# Patient Record
Sex: Male | Born: 1935 | Race: White | Hispanic: No | Marital: Married | Smoking: Former smoker
Health system: Southern US, Community
[De-identification: ages and names within clinical notes are randomized; demographics above are authoritative.]

## PROBLEM LIST (undated history)

## (undated) DIAGNOSIS — I10 Essential (primary) hypertension: Secondary | ICD-10-CM

## (undated) DIAGNOSIS — J449 Chronic obstructive pulmonary disease, unspecified: Secondary | ICD-10-CM

## (undated) DIAGNOSIS — I519 Heart disease, unspecified: Secondary | ICD-10-CM

## (undated) HISTORY — DX: Heart disease, unspecified: I51.9

## (undated) HISTORY — DX: Chronic obstructive pulmonary disease, unspecified: J44.9

## (undated) HISTORY — DX: Essential (primary) hypertension: I10

---

## 2006-06-12 ENCOUNTER — Inpatient Hospital Stay: Payer: Self-pay | Admitting: Internal Medicine

## 2006-06-12 ENCOUNTER — Other Ambulatory Visit: Payer: Self-pay

## 2006-06-12 ENCOUNTER — Ambulatory Visit: Payer: Self-pay | Admitting: Cardiology

## 2006-06-13 ENCOUNTER — Other Ambulatory Visit: Payer: Self-pay

## 2006-06-28 ENCOUNTER — Ambulatory Visit: Payer: Self-pay | Admitting: Internal Medicine

## 2008-11-05 DIAGNOSIS — I429 Cardiomyopathy, unspecified: Secondary | ICD-10-CM

## 2008-11-05 DIAGNOSIS — J449 Chronic obstructive pulmonary disease, unspecified: Secondary | ICD-10-CM

## 2008-11-05 DIAGNOSIS — I2510 Atherosclerotic heart disease of native coronary artery without angina pectoris: Secondary | ICD-10-CM | POA: Insufficient documentation

## 2008-11-05 DIAGNOSIS — I10 Essential (primary) hypertension: Secondary | ICD-10-CM

## 2010-11-04 ENCOUNTER — Encounter: Payer: Self-pay | Admitting: Cardiovascular Disease

## 2019-10-04 ENCOUNTER — Encounter: Payer: Self-pay | Admitting: Emergency Medicine

## 2019-10-04 ENCOUNTER — Other Ambulatory Visit: Payer: Self-pay

## 2019-10-04 ENCOUNTER — Inpatient Hospital Stay
Admission: EM | Admit: 2019-10-04 | Discharge: 2019-10-06 | DRG: 180 | Disposition: A | Discharge status: Home / Self Care | Payer: Medicare HMO | Attending: Internal Medicine | Admitting: Internal Medicine

## 2019-10-04 ENCOUNTER — Emergency Department: Payer: Medicare HMO

## 2019-10-04 DIAGNOSIS — I700 Atherosclerosis of aorta: Secondary | ICD-10-CM | POA: Diagnosis present

## 2019-10-04 DIAGNOSIS — J189 Pneumonia, unspecified organism: Secondary | ICD-10-CM | POA: Diagnosis present

## 2019-10-04 DIAGNOSIS — R222 Localized swelling, mass and lump, trunk: Secondary | ICD-10-CM

## 2019-10-04 DIAGNOSIS — Z8249 Family history of ischemic heart disease and other diseases of the circulatory system: Secondary | ICD-10-CM

## 2019-10-04 DIAGNOSIS — I110 Hypertensive heart disease with heart failure: Secondary | ICD-10-CM | POA: Diagnosis present

## 2019-10-04 DIAGNOSIS — I429 Cardiomyopathy, unspecified: Secondary | ICD-10-CM | POA: Diagnosis present

## 2019-10-04 DIAGNOSIS — E039 Hypothyroidism, unspecified: Secondary | ICD-10-CM | POA: Diagnosis present

## 2019-10-04 DIAGNOSIS — Z681 Body mass index (BMI) 19 or less, adult: Secondary | ICD-10-CM

## 2019-10-04 DIAGNOSIS — J439 Emphysema, unspecified: Secondary | ICD-10-CM

## 2019-10-04 DIAGNOSIS — Z87891 Personal history of nicotine dependence: Secondary | ICD-10-CM

## 2019-10-04 DIAGNOSIS — C3411 Malignant neoplasm of upper lobe, right bronchus or lung: Principal | ICD-10-CM

## 2019-10-04 DIAGNOSIS — Z20822 Contact with and (suspected) exposure to covid-19: Secondary | ICD-10-CM | POA: Diagnosis present

## 2019-10-04 DIAGNOSIS — C7972 Secondary malignant neoplasm of left adrenal gland: Secondary | ICD-10-CM | POA: Diagnosis present

## 2019-10-04 DIAGNOSIS — J948 Other specified pleural conditions: Secondary | ICD-10-CM | POA: Diagnosis present

## 2019-10-04 DIAGNOSIS — I5022 Chronic systolic (congestive) heart failure: Secondary | ICD-10-CM | POA: Diagnosis present

## 2019-10-04 DIAGNOSIS — C7970 Secondary malignant neoplasm of unspecified adrenal gland: Secondary | ICD-10-CM | POA: Diagnosis not present

## 2019-10-04 DIAGNOSIS — C7971 Secondary malignant neoplasm of right adrenal gland: Secondary | ICD-10-CM | POA: Diagnosis present

## 2019-10-04 DIAGNOSIS — E785 Hyperlipidemia, unspecified: Secondary | ICD-10-CM

## 2019-10-04 DIAGNOSIS — K869 Disease of pancreas, unspecified: Secondary | ICD-10-CM | POA: Diagnosis present

## 2019-10-04 DIAGNOSIS — R079 Chest pain, unspecified: Secondary | ICD-10-CM

## 2019-10-04 DIAGNOSIS — J440 Chronic obstructive pulmonary disease with acute lower respiratory infection: Secondary | ICD-10-CM | POA: Diagnosis present

## 2019-10-04 DIAGNOSIS — E43 Unspecified severe protein-calorie malnutrition: Secondary | ICD-10-CM | POA: Diagnosis present

## 2019-10-04 DIAGNOSIS — K8689 Other specified diseases of pancreas: Secondary | ICD-10-CM

## 2019-10-04 DIAGNOSIS — I10 Essential (primary) hypertension: Secondary | ICD-10-CM | POA: Diagnosis present

## 2019-10-04 DIAGNOSIS — J449 Chronic obstructive pulmonary disease, unspecified: Secondary | ICD-10-CM

## 2019-10-04 LAB — CBC WITH DIFFERENTIAL/PLATELET
Abs Immature Granulocytes: 0.49 10*3/uL — ABNORMAL HIGH (ref 0.00–0.07)
Basophils Absolute: 0.1 10*3/uL (ref 0.0–0.1)
Basophils Relative: 0 %
Eosinophils Absolute: 0.1 10*3/uL (ref 0.0–0.5)
Eosinophils Relative: 0 %
HCT: 34.4 % — ABNORMAL LOW (ref 39.0–52.0)
Hemoglobin: 10.8 g/dL — ABNORMAL LOW (ref 13.0–17.0)
Immature Granulocytes: 2 %
Lymphocytes Relative: 2 %
Lymphs Abs: 0.7 10*3/uL (ref 0.7–4.0)
MCH: 28.7 pg (ref 26.0–34.0)
MCHC: 31.4 g/dL (ref 30.0–36.0)
MCV: 91.5 fL (ref 80.0–100.0)
Monocytes Absolute: 1.3 10*3/uL — ABNORMAL HIGH (ref 0.1–1.0)
Monocytes Relative: 4 %
Neutro Abs: 29.2 10*3/uL — ABNORMAL HIGH (ref 1.7–7.7)
Neutrophils Relative %: 92 %
Platelets: 401 10*3/uL — ABNORMAL HIGH (ref 150–400)
RBC: 3.76 MIL/uL — ABNORMAL LOW (ref 4.22–5.81)
RDW: 14.2 % (ref 11.5–15.5)
Smear Review: NORMAL
WBC: 31.8 10*3/uL — ABNORMAL HIGH (ref 4.0–10.5)
nRBC: 0 % (ref 0.0–0.2)

## 2019-10-04 LAB — COMPREHENSIVE METABOLIC PANEL
ALT: 12 U/L (ref 0–44)
AST: 19 U/L (ref 15–41)
Albumin: 2.6 g/dL — ABNORMAL LOW (ref 3.5–5.0)
Alkaline Phosphatase: 69 U/L (ref 38–126)
Anion gap: 10 (ref 5–15)
BUN: 25 mg/dL — ABNORMAL HIGH (ref 8–23)
CO2: 23 mmol/L (ref 22–32)
Calcium: 8.6 mg/dL — ABNORMAL LOW (ref 8.9–10.3)
Chloride: 103 mmol/L (ref 98–111)
Creatinine, Ser: 0.83 mg/dL (ref 0.61–1.24)
GFR calc Af Amer: >60 mL/min (ref >60)
GFR calc non Af Amer: >60 mL/min (ref >60)
Glucose, Bld: 120 mg/dL — ABNORMAL HIGH (ref 70–99)
Potassium: 4.3 mmol/L (ref 3.5–5.1)
Sodium: 136 mmol/L (ref 135–145)
Total Bilirubin: 0.6 mg/dL (ref 0.3–1.2)
Total Protein: 6.5 g/dL (ref 6.5–8.1)

## 2019-10-04 LAB — APTT: aPTT: 35 seconds (ref 24–36)

## 2019-10-04 LAB — BRAIN NATRIURETIC PEPTIDE: B Natriuretic Peptide: 148 pg/mL — ABNORMAL HIGH (ref 0.0–100.0)

## 2019-10-04 LAB — PROTIME-INR
INR: 1.1 (ref 0.8–1.2)
Prothrombin Time: 13.4 seconds (ref 11.4–15.2)

## 2019-10-04 LAB — SARS CORONAVIRUS 2 BY RT PCR (HOSPITAL ORDER, PERFORMED IN ~~LOC~~ HOSPITAL LAB): SARS Coronavirus 2: NEGATIVE

## 2019-10-04 MED ORDER — SODIUM CHLORIDE 0.9 % IV SOLN
2.0000 g | Freq: Once | INTRAVENOUS | Status: AC
  Administered 2019-10-04: 2 g via INTRAVENOUS
  Filled 2019-10-04: qty 20

## 2019-10-04 MED ORDER — HEPARIN SODIUM (PORCINE) 5000 UNIT/ML IJ SOLN
5000.0000 [IU] | Freq: Three times a day (TID) | INTRAMUSCULAR | Status: DC
  Administered 2019-10-04 – 2019-10-05 (×3): 5000 [IU] via SUBCUTANEOUS
  Filled 2019-10-04 (×3): qty 1

## 2019-10-04 MED ORDER — ALBUTEROL SULFATE (2.5 MG/3ML) 0.083% IN NEBU
3.0000 mL | INHALATION_SOLUTION | RESPIRATORY_TRACT | Status: DC | PRN
  Administered 2019-10-04: 3 mL via RESPIRATORY_TRACT
  Filled 2019-10-04: qty 3

## 2019-10-04 MED ORDER — SODIUM CHLORIDE 0.9 % IV SOLN
500.0000 mg | Freq: Once | INTRAVENOUS | Status: AC
  Administered 2019-10-04: 500 mg via INTRAVENOUS
  Filled 2019-10-04: qty 500

## 2019-10-04 MED ORDER — ENSURE ENLIVE PO LIQD
237.0000 mL | Freq: Two times a day (BID) | ORAL | Status: DC
  Administered 2019-10-05 – 2019-10-06 (×3): 237 mL via ORAL

## 2019-10-04 MED ORDER — AMLODIPINE BESYLATE 10 MG PO TABS
10.0000 mg | ORAL_TABLET | Freq: Every day | ORAL | Status: DC
  Administered 2019-10-05 – 2019-10-06 (×2): 10 mg via ORAL
  Filled 2019-10-04 (×2): qty 1

## 2019-10-04 MED ORDER — ONDANSETRON HCL 4 MG/2ML IJ SOLN: 4.0000 mg | Freq: Three times a day (TID) | INTRAMUSCULAR | Status: DC | PRN

## 2019-10-04 MED ORDER — ACETAMINOPHEN 325 MG PO TABS
650.0000 mg | ORAL_TABLET | Freq: Four times a day (QID) | ORAL | Status: DC | PRN
  Administered 2019-10-04: 650 mg via ORAL
  Filled 2019-10-04: qty 2

## 2019-10-04 MED ORDER — DM-GUAIFENESIN ER 30-600 MG PO TB12
1.0000 | ORAL_TABLET | Freq: Two times a day (BID) | ORAL | Status: DC | PRN
  Administered 2019-10-04: 1 via ORAL
  Filled 2019-10-04: qty 1

## 2019-10-04 MED ORDER — ROSUVASTATIN CALCIUM 10 MG PO TABS
10.0000 mg | ORAL_TABLET | Freq: Every day | ORAL | Status: DC
  Administered 2019-10-04 – 2019-10-05 (×2): 10 mg via ORAL
  Filled 2019-10-04 (×3): qty 1

## 2019-10-04 MED ORDER — LEVOTHYROXINE SODIUM 50 MCG PO TABS
50.0000 ug | ORAL_TABLET | Freq: Every day | ORAL | Status: DC
  Administered 2019-10-05 – 2019-10-06 (×2): 50 ug via ORAL
  Filled 2019-10-04 (×2): qty 1

## 2019-10-04 MED ORDER — SODIUM CHLORIDE 0.9 % IV SOLN
1.0000 g | INTRAVENOUS | Status: DC
  Administered 2019-10-05 – 2019-10-06 (×2): 1 g via INTRAVENOUS
  Filled 2019-10-04: qty 1
  Filled 2019-10-04: qty 10
  Filled 2019-10-04: qty 1

## 2019-10-04 MED ORDER — ASPIRIN EC 81 MG PO TBEC
81.0000 mg | DELAYED_RELEASE_TABLET | Freq: Every day | ORAL | Status: DC
  Administered 2019-10-05: 10:00:00 81 mg via ORAL
  Filled 2019-10-04: qty 1

## 2019-10-04 MED ORDER — LISINOPRIL 10 MG PO TABS
10.0000 mg | ORAL_TABLET | Freq: Every day | ORAL | Status: DC
  Administered 2019-10-05 – 2019-10-06 (×2): 10 mg via ORAL
  Filled 2019-10-04 (×2): qty 1

## 2019-10-04 MED ORDER — HYDRALAZINE HCL 20 MG/ML IJ SOLN: 5.0000 mg | INTRAMUSCULAR | Status: DC | PRN

## 2019-10-04 MED ORDER — IOHEXOL 350 MG/ML SOLN
75.0000 mL | Freq: Once | INTRAVENOUS | Status: AC | PRN
  Administered 2019-10-04: 75 mL via INTRAVENOUS

## 2019-10-04 MED ORDER — SODIUM CHLORIDE 0.9 % IV SOLN
500.0000 mg | INTRAVENOUS | Status: DC
  Administered 2019-10-05 – 2019-10-06 (×2): 500 mg via INTRAVENOUS
  Filled 2019-10-04 (×3): qty 500

## 2019-10-04 NOTE — ED Notes (Signed)
Lab at room for collection, pt given water and procedure explained

## 2019-10-04 NOTE — ED Notes (Signed)
Lab called to draw second set of blood cultures. This RN attempted x2, both times veins blew. Lab to come collect. Son in room, pt alert, calm.

## 2019-10-04 NOTE — ED Triage Notes (Signed)
Pt to ED via POV c/o shortness of breath, 20 pound wt loss in the last month, and right shoulder pain. Pt is tachypneic in triage, breathing around 40 bpm. Pt is taking shallow breaths. Pt is able to speak in complete sentences and SpO2 is 97%.

## 2019-10-04 NOTE — ED Triage Notes (Addendum)
First nurse note- right shoulder pain and right side pain.  + SHOB.  Pulled for EKG/triage.

## 2019-10-04 NOTE — H&P (Signed)
History and Physical    RUTGER SALTON ZOX:[id number] DOB: Mar 13, 1935 DOA: 10/04/2019  Referring MD/NP/PA:   PCP: System, Pcp Not In   Patient coming from:  The patient is coming from home.  At baseline, pt is independent for most of ADL.        Chief Complaint: Shortness of breath  HPI: Donald Gibbs is a 85 y.o. male with medical history significant of hypertension, hyperlipidemia, COPD, hypothyroidism, sCHF with EF of 40%, who presents with shortness of breath.  Patient has been having shortness of breath for almost 3 weeks.  He also reports right-sided chest pain, right shoulder pain, and mild cough.  He has 20 pounds of weight loss recently.  No fever or chills.  Patient does not have nausea, vomiting, diarrhea, abdominal pain, symptoms of UTI or unilateral weakness.  ED Course: pt was found to have WBC 31.8, pending COVID-19 PCR, electrolytes renal function okay, temperature 96.7, blood pressure 130/61, heart rate 84, RR 40 -->16, oxygen saturation 97% on room air.  Chest x-ray showed pleural-based mass.  Patient is admitted to [facility] bed as inpatient.  Dr.Brahmanday of oncology is consulted by EDP  # CT angiogram of chest showed:  1. Large pleural base mass with right chest wall invasion and resorption of the second third fourth and fifth ribs in varying amounts. Associated right-sided pleural effusion is noted as well as significant lymphangitic spread of neoplasm in the right upper lobe.   2. Bilateral adrenal lesions are noted suspicious for metastatic disease. 3. There is a soft tissue mass lesion which appears to arise from the head of the pancreas superiorly. This may represent metastatic disease although the possibility of a pancreatic primary deserves consideration. 4. No evidence of pulmonary emboli.  5. Aortic Atherosclerosis (ICD10-I70.0) and Emphysema (ICD10-J43.9).   Review of Systems:   General: no fevers, chills, has body weight loss, has poor appetite, has  fatigue HEENT: no blurry vision, hearing changes or sore throat Respiratory: has dyspnea, coughing, no wheezing CV: has chest pain, no palpitations GI: no nausea, vomiting, abdominal pain, diarrhea, constipation GU: no dysuria, burning on urination, increased urinary frequency, hematuria  Ext: no leg edema Neuro: no unilateral weakness, numbness, or tingling, no vision change or hearing loss Skin: no rash, no skin tear. MSK: No muscle spasm, no deformity, no limitation of range of movement in spin. Has right shoulder pain. Heme: No easy bruising.  Travel history: No recent long distant travel.  Allergy: No Known Allergies  Past Medical History:  Diagnosis Date  . Cardiomyopathy    secondary. echo-EF 25-35%. Adensoine myoview- EF 31% w/a moderately dilated ventricle. there was thinning of the inferior wall, tissue attenuation v infarct, no ischemia.   Marland Kitchen COPD (chronic obstructive pulmonary disease) ([place])   . Decreased left ventricular function   . HTN (hypertension)     History reviewed. No pertinent surgical history.  Social History:  reports that he has quit smoking. He has never used smokeless tobacco. He reports previous alcohol use. He reports previous drug use.  Family History:  Family History  Problem Relation Age of Onset  . Heart attack Brother      Prior to Admission medications   Not on File    Physical Exam: Vitals:   10/04/19 1430 10/04/19 1500 10/04/19 1515 10/04/19 1706  BP: (!) 142/62 140/69  (!) 134/51  Pulse: 99 (!) 101  98  Resp:   (!) 32 (!) 22  Temp:    98.3 F (36.8  C)  TempSrc:    Oral  SpO2: 92% 93%  90%  Weight:      Height:       General: Not in acute distress.  Thin body habitus HEENT:       Eyes: PERRL, EOMI, no scleral icterus.       ENT: No discharge from the ears and nose, no pharynx injection, no tonsillar enlargement.        Neck: No JVD, no bruit, no mass felt. Heme: No neck lymph node enlargement. Cardiac: S1/S2, RRR, No  murmurs, No gallops or rubs. Respiratory: No rales, wheezing, rhonchi or rubs. GI: Soft, nondistended, nontender, no rebound pain, no organomegaly, BS present. GU: No hematuria Ext: No pitting leg edema bilaterally. 2+DP/PT pulse bilaterally. Musculoskeletal: No joint deformities, No joint redness or warmth, no limitation of ROM in spin. Skin: No rashes.  Neuro: Alert, oriented X3, cranial nerves II-XII grossly intact, moves all extremities normally. Psych: Patient is not psychotic, no suicidal or hemocidal ideation.  Labs on Admission: I have personally reviewed following labs and imaging studies  CBC: Recent Labs  Lab 10/04/19 1030  WBC 31.8*  NEUTROABS 29.2*  HGB 10.8*  HCT 34.4*  MCV 91.5  PLT 616*   Basic Metabolic Panel: Recent Labs  Lab 10/04/19 1030  NA 136  K 4.3  CL 103  CO2 23  GLUCOSE 120*  BUN 25*  CREATININE 0.83  CALCIUM 8.6*   GFR: Estimated Creatinine Clearance: 53.6 mL/min (by C-G formula based on SCr of 0.83 mg/dL). Liver Function Tests: Recent Labs  Lab 10/04/19 1030  AST 19  ALT 12  ALKPHOS 69  BILITOT 0.6  PROT 6.5  ALBUMIN 2.6*   No results for input(s): LIPASE, AMYLASE in the last 168 hours. No results for input(s): AMMONIA in the last 168 hours. Coagulation Profile: No results for input(s): INR, PROTIME in the last 168 hours. Cardiac Enzymes: No results for input(s): CKTOTAL, CKMB, CKMBINDEX, TROPONINI in the last 168 hours. BNP (last 3 results) No results for input(s): PROBNP in the last 8760 hours. HbA1C: No results for input(s): HGBA1C in the last 72 hours. CBG: No results for input(s): GLUCAP in the last 168 hours. Lipid Profile: No results for input(s): CHOL, HDL, LDLCALC, TRIG, CHOLHDL, LDLDIRECT in the last 72 hours. Thyroid Function Tests: No results for input(s): TSH, T4TOTAL, FREET4, T3FREE, THYROIDAB in the last 72 hours. Anemia Panel: No results for input(s): VITAMINB12, FOLATE, FERRITIN, TIBC, IRON, RETICCTPCT in  the last 72 hours. Urine analysis: No results found for: COLORURINE, APPEARANCEUR, LABSPEC, PHURINE, GLUCOSEU, HGBUR, BILIRUBINUR, KETONESUR, PROTEINUR, UROBILINOGEN, NITRITE, LEUKOCYTESUR Sepsis Labs: @LABRCNTIP (procalcitonin:4,lacticidven:4) )No results found for this or any previous visit (from the past 240 hour(s)).   Radiological Exams on Admission: DG Chest 2 View  Result Date: 10/04/2019 CLINICAL DATA:  Shortness of breath EXAM: CHEST - 2 VIEW COMPARISON:  06/12/2006 FINDINGS: Cardiac shadow is within normal limits. Left lung is hyperinflated but clear. There is a pleural based mass lesion identified along the lateral chest wall on the right which measures 9.5 cm in greatest dimension. There are destructive changes involving the second, third and fourth ribs consistent with local invasion. Associated parenchymal density is noted likely related to lymphangitic spread in the upper lobe. No sizable effusion is seen. IMPRESSION: Pleural based mass lesion consistent with pulmonary neoplasm with erosive changes involving the second, third and fourth ribs as well as likely lymphangitic spread within the right upper lobe. CT of the chest is recommended for further  evaluation. Electronically Signed   By: Inez Catalina M.D.   On: 10/04/2019 11:45   CT Angio Chest PE W and/or Wo Contrast  Result Date: 10/04/2019 CLINICAL DATA:  Pleural mass with bony destruction on recent chest x-ray, shortness of breath EXAM: CT ANGIOGRAPHY CHEST WITH CONTRAST TECHNIQUE: Multidetector CT imaging of the chest was performed using the standard protocol during bolus administration of intravenous contrast. Multiplanar CT image reconstructions and MIPs were obtained to evaluate the vascular anatomy. CONTRAST:  85mL OMNIPAQUE IOHEXOL 350 MG/ML SOLN COMPARISON:  Chest x-ray from earlier in the same day FINDINGS: Cardiovascular: Thoracic aorta and its branches demonstrate atherosclerotic calcifications. No aneurysm or dissection is  seen. No cardiac enlargement is noted. Coronary calcifications are noted. The pulmonary artery shows a normal branching pattern without intraluminal filling defect to suggest pulmonary embolism. Mediastinum/Nodes: Thoracic inlet is within normal limits. No sizable hilar or mediastinal adenopathy is noted. The esophagus as visualized is within normal limits. Lungs/Pleura: Left lung demonstrates emphysematous change and apical blebs. No focal infiltrate or effusion is seen. Small 3 mm nodule is noted in the lateral aspect of the left lower lobe best seen on image number 104 of series 6. No other definitive nodules are seen. In the right upper lobe, there is a pleural base mass lesion with chest wall invasion which measures at least 12.8 x 9.0 cm. There are erosive changes of the second third and fourth ribs related to the underlying mass lesion. Additionally there are invasion into the subscapularis rib region. Diffuse lymphangitic spread of neoplasm is noted throughout the right upper lobe similar to that seen on prior plain film examination. Calcified granuloma is noted in the right middle lobe. Large right-sided pleural effusion is seen. Upper Abdomen: Large cystic lesion is noted in the left kidney measuring 5 cm most consistent with a simple cyst. Smaller cysts are noted within the upper pole of the left kidney. Nonobstructing 4 mm stone is noted in the upper pole of the right kidney. Spleen is within normal limits. The liver and gallbladder are unremarkable. Focal soft tissue mass lesion is noted which appears to arise from the head of the pancreas. It envelops the right hepatic artery and measures approximately 6.9 x 5.1 cm. Bilateral adrenal lesions are noted suspicious for metastatic disease. Largest of these lies on the right measuring 3.1 cm. Musculoskeletal: Resorption of the second third and fourth ribs on the right is seen related to the underlying pleural based mass lesion. An additional pleural based  lesion involving the right chest wall is noted anteriorly best seen on image number 231 of series 5. This measures approximately 2.8 cm and causes some destruction of the anterior aspect of the right fifth rib. No other bony destructive lesions are seen. Review of the MIP images confirms the above findings. IMPRESSION: Large pleural base mass with right chest wall invasion and resorption of the second third fourth and fifth ribs in varying amounts. Associated right-sided pleural effusion is noted as well as significant lymphangitic spread of neoplasm in the right upper lobe. PET-CT in further workup is recommended as clinically indicated. Bilateral adrenal lesions are noted suspicious for metastatic disease. There is a soft tissue mass lesion which appears to arise from the head of the pancreas superiorly. This may represent metastatic disease although the possibility of a pancreatic primary deserves consideration. No evidence of pulmonary emboli. Aortic Atherosclerosis (ICD10-I70.0) and Emphysema (ICD10-J43.9). Electronically Signed   By: Inez Catalina M.D.   On: 10/04/2019 13:01  EKG: Independently reviewed.  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 -will admit to MedSurg bed as inpatient -Oncologist, Dr. Rogue Bussing is consulted by EDP -Started antibiotics (Rocephin and  azithromycin) empirically due to significant leukocytosis with WBC 31.8 -Follow-up blood culture -As needed albuterol and Mucinex  HTN:  -Continue home medications: Amlodipine, lisinopril -Hold HCTZ -hydralazine prn  COPD with chronic bronchitis (HCC) -As needed albuterol and Mucinex  Chronic systolic CHF (congestive heart failure) ([place]): 2D echo 01/29/2014 showed EF of 40%.  Patient does not have leg edema.  CHF seem to be compensated. -Hold HCTZ  HLD (hyperlipidemia) -Crestor  Hypothyroidism -Synthroid  Protein-calorie malnutrition, severe (HCC) -Start Ensure -Consult to nutrition   DVT ppx: SQ Heparin   Code Status: Full code per his son who is POA Family Communication:   Yes, patient's  Daughter and son by phone Disposition Plan:  Anticipate discharge back to previous home environment Consults called:  Dr. Rogue Bussing of oncology Admission status: Med-surg bed for as inpt      Status is: Inpatient  Remains inpatient appropriate because:Inpatient level of care appropriate due to severity of illness  Dispo: The patient is from: Home              Anticipated d/c is to: Home              Anticipated d/c date is: 2 days              Patient currently is not medically stable to d/c.            Date of Service 10/04/2019    Ivor Costa [facility]   If 7PM-7AM, please contact night-coverage [email or web address] 10/04/2019, 5:45 PM

## 2019-10-04 NOTE — ED Provider Notes (Signed)
[facility] Emergency Department Provider Note  ____________________________________________  Time seen: Approximately 11:55 AM  I have reviewed the triage vital signs and the nursing notes.   HISTORY  Chief Complaint Shortness of Breath, Weight Loss, and Shoulder Pain    HPI Donald Gibbs is a 85 y.o. male with a history of cardiomyopathy, COPD, HTN who comes to the ED c/o right sided chest pain and shoulder pain for the past several weeks.  He also notes that he is lost 20 pounds over the last month and has occasional cough.  Feels like he cannot take a deep breath. associated with sob. No aggravating or alleviating factors.  Denies any GI symptoms such as abdominal pain nausea vomiting diarrhea constipation.  Normal appetite.  Denies headaches vision changes or seizures or syncope.   Past Medical History:  Diagnosis Date  . Cardiomyopathy    secondary. echo-EF 25-35%. Adensoine myoview- EF 31% w/a moderately dilated ventricle. there was thinning of the inferior wall, tissue attenuation v infarct, no ischemia.   Marland Kitchen COPD (chronic obstructive pulmonary disease) ([place])   . Decreased left ventricular function   . HTN (hypertension)      Patient Active Problem List   Diagnosis Date Noted  . 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 HYPERTENSION, UNSPECIFIED 11/05/2008  . CARDIOMYOPATHY, SECONDARY 11/05/2008  . LEFT VENTRICULAR FUNCTION, DECREASED 11/05/2008  . COPD 11/05/2008     History reviewed. No pertinent surgical history.   Prior to Admission medications   Not on File     Allergies Patient has no known allergies.   No family history on file.  Social History Social History   Tobacco Use  . Smoking status: Former Research scientist (life sciences)  . Smokeless tobacco: Never Used  Substance Use Topics  . Alcohol use: Not Currently  . Drug use: Not Currently    Review of Systems  Constitutional:   No fever or chills.  ENT:   No sore throat. No  rhinorrhea. Cardiovascular: Positive chest pain as above without syncope. Respiratory: Positive shortness of breath and nonproductive cough. Gastrointestinal:   Negative for abdominal pain, vomiting and diarrhea.  Musculoskeletal:   Negative for focal pain or swelling All other systems reviewed and are negative except as documented above in ROS and HPI.  ____________________________________________   PHYSICAL EXAM:  VITAL SIGNS: ED Triage Vitals  Enc Vitals Group     BP 10/04/19 1040 (!) 124/57     Pulse Rate 10/04/19 1040 96     Resp 10/04/19 1040 (!) 40     Temp 10/04/19 1051 (!) 96.7 F (35.9 C)     Temp Source 10/04/19 1051 Rectal     SpO2 10/04/19 1040 97 %     Weight 10/04/19 1042 123 lb 14.4 oz (56.2 kg)     Height 10/04/19 1042 5\' 9"  (1.753 m)     Head Circumference --      Peak Flow --      Pain Score 10/04/19 1042 10     Pain Loc --      Pain Edu? --      Excl. in [place]? --     Vital signs reviewed, nursing assessments reviewed.   Constitutional:   Alert and oriented. Non-toxic appearance.  Underweight appearance Eyes:   Conjunctivae are normal. EOMI. PERRL. ENT      Head:   Normocephalic and atraumatic.      Nose:   Wearing a mask.      Mouth/Throat:   Wearing a mask.  Neck:   No meningismus. Full ROM. Hematological/Lymphatic/Immunilogical:   No cervical lymphadenopathy. Cardiovascular:   RRR. Symmetric bilateral radial and DP pulses.  No murmurs. Cap refill less than 2 seconds. Respiratory:   Tachypnea with normal work of breathing.  Breath sounds symmetric and clear, no wheezes or crackles Gastrointestinal:   Soft and nontender. Non distended. There is no CVA tenderness.  No rebound, rigidity, or guarding.  Musculoskeletal:   Normal range of motion in all extremities. No joint effusions.  No lower extremity tenderness.  No edema.  Chest wall nontender without mass or deformity along the ribs axilla or shoulder Neurologic:   Normal speech and language.   Motor grossly intact. No acute focal neurologic deficits are appreciated.  Skin:    Skin is warm, dry and intact. No rash noted.  No petechiae, purpura, or bullae.  ____________________________________________    LABS (pertinent positives/negatives) (all labs ordered are listed, but only abnormal results are displayed) Labs Reviewed  CBC WITH DIFFERENTIAL/PLATELET - Abnormal; Notable for the following components:      Result Value   WBC 31.8 (*)    RBC 3.76 (*)    Hemoglobin 10.8 (*)    HCT 34.4 (*)    Platelets 401 (*)    Neutro Abs 29.2 (*)    Monocytes Absolute 1.3 (*)    Abs Immature Granulocytes 0.49 (*)    All other components within normal limits  COMPREHENSIVE METABOLIC PANEL - Abnormal; Notable for the following components:   Glucose, Bld 120 (*)    BUN 25 (*)    Calcium 8.6 (*)    Albumin 2.6 (*)    All other components within normal limits   ____________________________________________   EKG  Interpreted by me Sinus rhythm rate of 88, normal axis and intervals.  Poor R wave progression.  Normal ST segments and T waves.  No ischemic changes.  ____________________________________________    Donald Gibbs  DG Chest 2 View  Result Date: 10/04/2019 CLINICAL DATA:  Shortness of breath EXAM: CHEST - 2 VIEW COMPARISON:  06/12/2006 FINDINGS: Cardiac shadow is within normal limits. Left lung is hyperinflated but clear. There is a pleural based mass lesion identified along the lateral chest wall on the right which measures 9.5 cm in greatest dimension. There are destructive changes involving the second, third and fourth ribs consistent with local invasion. Associated parenchymal density is noted likely related to lymphangitic spread in the upper lobe. No sizable effusion is seen. IMPRESSION: Pleural based mass lesion consistent with pulmonary neoplasm with erosive changes involving the second, third and fourth ribs as well as likely lymphangitic spread within the right upper  lobe. CT of the chest is recommended for further evaluation. Electronically Signed   By: Inez Catalina M.D.   On: 10/04/2019 11:45   CT Angio Chest PE W and/or Wo Contrast  Result Date: 10/04/2019 CLINICAL DATA:  Pleural mass with bony destruction on recent chest x-ray, shortness of breath EXAM: CT ANGIOGRAPHY CHEST WITH CONTRAST TECHNIQUE: Multidetector CT imaging of the chest was performed using the standard protocol during bolus administration of intravenous contrast. Multiplanar CT image reconstructions and MIPs were obtained to evaluate the vascular anatomy. CONTRAST:  100mL OMNIPAQUE IOHEXOL 350 MG/ML SOLN COMPARISON:  Chest x-ray from earlier in the same day FINDINGS: Cardiovascular: Thoracic aorta and its branches demonstrate atherosclerotic calcifications. No aneurysm or dissection is seen. No cardiac enlargement is noted. Coronary calcifications are noted. The pulmonary artery shows a normal branching pattern without intraluminal filling defect to suggest  pulmonary embolism. Mediastinum/Nodes: Thoracic inlet is within normal limits. No sizable hilar or mediastinal adenopathy is noted. The esophagus as visualized is within normal limits. Lungs/Pleura: Left lung demonstrates emphysematous change and apical blebs. No focal infiltrate or effusion is seen. Small 3 mm nodule is noted in the lateral aspect of the left lower lobe best seen on image number 104 of series 6. No other definitive nodules are seen. In the right upper lobe, there is a pleural base mass lesion with chest wall invasion which measures at least 12.8 x 9.0 cm. There are erosive changes of the second third and fourth ribs related to the underlying mass lesion. Additionally there are invasion into the subscapularis rib region. Diffuse lymphangitic spread of neoplasm is noted throughout the right upper lobe similar to that seen on prior plain film examination. Calcified granuloma is noted in the right middle lobe. Large right-sided pleural  effusion is seen. Upper Abdomen: Large cystic lesion is noted in the left kidney measuring 5 cm most consistent with a simple cyst. Smaller cysts are noted within the upper pole of the left kidney. Nonobstructing 4 mm stone is noted in the upper pole of the right kidney. Spleen is within normal limits. The liver and gallbladder are unremarkable. Focal soft tissue mass lesion is noted which appears to arise from the head of the pancreas. It envelops the right hepatic artery and measures approximately 6.9 x 5.1 cm. Bilateral adrenal lesions are noted suspicious for metastatic disease. Largest of these lies on the right measuring 3.1 cm. Musculoskeletal: Resorption of the second third and fourth ribs on the right is seen related to the underlying pleural based mass lesion. An additional pleural based lesion involving the right chest wall is noted anteriorly best seen on image number 231 of series 5. This measures approximately 2.8 cm and causes some destruction of the anterior aspect of the right fifth rib. No other bony destructive lesions are seen. Review of the MIP images confirms the above findings. IMPRESSION: Large pleural base mass with right chest wall invasion and resorption of the second third fourth and fifth ribs in varying amounts. Associated right-sided pleural effusion is noted as well as significant lymphangitic spread of neoplasm in the right upper lobe. PET-CT in further workup is recommended as clinically indicated. Bilateral adrenal lesions are noted suspicious for metastatic disease. There is a soft tissue mass lesion which appears to arise from the head of the pancreas superiorly. This may represent metastatic disease although the possibility of a pancreatic primary deserves consideration. No evidence of pulmonary emboli. Aortic Atherosclerosis (ICD10-I70.0) and Emphysema (ICD10-J43.9). Electronically Signed   By: Inez Catalina M.D.   On: 10/04/2019 13:01     ____________________________________________   PROCEDURES Procedures  ____________________________________________  DIFFERENTIAL DIAGNOSIS   Lung cancer, pneumonia, aspiration, pulmonary embolism  CLINICAL IMPRESSION / ASSESSMENT AND PLAN / ED COURSE  Medications ordered in the ED: Medications  cefTRIAXone (ROCEPHIN) 2 g in sodium chloride 0.9 % 100 mL IVPB (2 g Intravenous New Bag/Given 10/04/19 1358)  azithromycin (ZITHROMAX) 500 mg in sodium chloride 0.9 % 250 mL IVPB (500 mg Intravenous New Bag/Given 10/04/19 1402)  iohexol (OMNIPAQUE) 350 MG/ML injection 75 mL (75 mLs Intravenous Contrast Given 10/04/19 1235)    Pertinent labs & imaging results that were available during my care of the patient were reviewed by me and considered in my medical decision making (see chart for details).  CHASETON YEPIZ was evaluated in Emergency Department on 10/04/2019 for the symptoms described  in the history of present illness. He was evaluated in the context of the global COVID-19 pandemic, which necessitated consideration that the patient might be at risk for infection with the SARS-CoV-2 virus that causes COVID-19. Institutional protocols and algorithms that pertain to the evaluation of patients at risk for COVID-19 are in a state of rapid change based on information released by regulatory bodies including the CDC and federal and state organizations. These policies and algorithms were followed during the patient's care in the ED.   Patient presents with chest pain shortness of breath for the past several weeks.  Chest x-ray interpreted by me shows a large right upper lung mass concerning for undiagnosed lung cancer.  With this finding and his chest pain and shortness of breath, will need a CT scan to further evaluate as well as rule out pulmonary embolism.  Suspicion shared with patient, he is agreeable to work-up and hospitalization as needed.  Will follow up labs.  Currently on room air with  oxygen saturation of 97%.  Clinical Course as of Oct 03 1404  Sat Oct 04, 2019  1335 Results d/w Onc Dr. Rogue Bussing who recommends hospitalization for initial workup including biopsy   [PS]  21 Pt does report productive cough, so with COPD and pronounced leukocytosis, I'll give ceftriaxone and azithromycin. Pt is not septic.    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 Dictation errors may occur despite best attempts at proofreading.   Carrie Mew, MD 10/04/19 1406

## 2019-10-04 NOTE — ED Notes (Signed)
Second attempt at report call att, Stanton Kidney, RN, reports unsure of receiving RN only that several admissions have come to floor recently, this RN reports transport for pt within the next 5-10 mins and for RN to call me

## 2019-10-04 NOTE — Progress Notes (Signed)
Son Jeanell Sparrow would like to be contacted "before any tests or procedures" performed so he will be aware.

## 2019-10-04 NOTE — ED Notes (Signed)
Call from [place], [place], report given and questions answered, floor reports having no indication of pt coming, this RN reports this is third call regarding pt coming to room since bed ready at [phone]

## 2019-10-04 NOTE — ED Notes (Signed)
First call for report, floor (unidentified male) reports the receiving RN just received another pt, "please give 10 min and call back", this RN left instructions to call me as well at [phone]  Pt updated, son Ray, at bedside att, pt given PB crackers and fruit cup

## 2019-10-04 NOTE — ED Notes (Signed)
Pt updated on room assignment, pt c/o of pain and discomfort in stretcher, meds offered, fluids given

## 2019-10-04 NOTE — ED Notes (Signed)
Pt transported to CT ?

## 2019-10-05 ENCOUNTER — Encounter: Payer: Self-pay | Admitting: Internal Medicine

## 2019-10-05 DIAGNOSIS — J948 Other specified pleural conditions: Secondary | ICD-10-CM

## 2019-10-05 LAB — BASIC METABOLIC PANEL
Anion gap: 9 (ref 5–15)
BUN: 24 mg/dL — ABNORMAL HIGH (ref 8–23)
CO2: 23 mmol/L (ref 22–32)
Calcium: 8.7 mg/dL — ABNORMAL LOW (ref 8.9–10.3)
Chloride: 108 mmol/L (ref 98–111)
Creatinine, Ser: 0.9 mg/dL (ref 0.61–1.24)
GFR calc Af Amer: >60 mL/min (ref >60)
GFR calc non Af Amer: >60 mL/min (ref >60)
Glucose, Bld: 83 mg/dL (ref 70–99)
Potassium: 4.3 mmol/L (ref 3.5–5.1)
Sodium: 140 mmol/L (ref 135–145)

## 2019-10-05 LAB — CBC
HCT: 31 % — ABNORMAL LOW (ref 39.0–52.0)
Hemoglobin: 10 g/dL — ABNORMAL LOW (ref 13.0–17.0)
MCH: 29.2 pg (ref 26.0–34.0)
MCHC: 32.3 g/dL (ref 30.0–36.0)
MCV: 90.4 fL (ref 80.0–100.0)
Platelets: 397 10*3/uL (ref 150–400)
RBC: 3.43 MIL/uL — ABNORMAL LOW (ref 4.22–5.81)
RDW: 14.3 % (ref 11.5–15.5)
WBC: 31.2 10*3/uL — ABNORMAL HIGH (ref 4.0–10.5)
nRBC: 0 % (ref 0.0–0.2)

## 2019-10-05 LAB — MAGNESIUM: Magnesium: 1.8 mg/dL (ref 1.7–2.4)

## 2019-10-05 LAB — PROCALCITONIN: Procalcitonin: 0.95 ng/mL

## 2019-10-05 NOTE — Assessment & Plan Note (Addendum)
#  85 year old male patient history of CHF; is currently admitted to the hospital for shortness of breath/chest wall pain.  #Worsening shortness of breath chest wall pain/CT scan suggestive of right lung-pleural-based metastases; adrenal metastases-very concerning for lung primary malignancy; awaiting evaluation with IR/biopsy this morning.  #Ischemic cardiomyopathy-currently compensated.  Stable  #Leukocytosis-predominant neutrophilia; likely paraneoplastic/underlying malignancy; less likely infection.  Stable;  procalcitonin normal.  #Disposition: Patient discharged home post biopsy if clinically stable.;  Will make appointment for follow-up in the next week or so/to review the results of the biopsy.  Patient also needs a PET scan outpatient.

## 2019-10-05 NOTE — Consult Note (Signed)
[facility] CONSULT NOTE  Patient Care Team: System, Pcp Not In as PCP - General  CHIEF COMPLAINTS/PURPOSE OF CONSULTATION: Lung mass  HISTORY OF PRESENTING ILLNESS:  Donald Gibbs 85 y.o.  male patient with remote history of smoking [quit more than 20 years ago] and ischemic cardiomyopathy is currently admitted hospital for right posterior chest wall pain/worsening shortness of breath cough.  CT scan emergency room showed-pleural-based mass; mild pleural effusion; adrenal lesions concerning for metastases.  Patient also elevated white count of 30,000 hemoglobin of 10; normal platelets.  Patient is continued to hospital for pain control; IV antibiotics; and consideration of biopsy in the morning.  Patient states his pain is fairly well controlled at this time.  Denies any nausea vomiting headache.  Complains of generalized weakness.  Review of Systems  Constitutional: Positive for malaise/fatigue and weight loss. Negative for chills, diaphoresis and fever.  HENT: Negative for nosebleeds and sore throat.   Eyes: Negative for double vision.  Respiratory: Positive for cough, sputum production and shortness of breath. Negative for hemoptysis and wheezing.   Cardiovascular: Negative for chest pain, palpitations, orthopnea and leg swelling.  Gastrointestinal: Negative for abdominal pain, blood in stool, constipation, diarrhea, heartburn, melena, nausea and vomiting.  Genitourinary: Negative for dysuria, frequency and urgency.  Musculoskeletal: Positive for back pain and joint pain.  Skin: Negative.  Negative for itching and rash.  Neurological: Negative for dizziness, tingling, focal weakness, weakness and headaches.  Endo/Heme/Allergies: Does not bruise/bleed easily.  Psychiatric/Behavioral: Negative for depression. The patient is not nervous/anxious and does not have insomnia.     MEDICAL HISTORY:  Past Medical History:  Diagnosis Date  . Cardiomyopathy    secondary.  echo-EF 25-35%. Adensoine myoview- EF 31% w/a moderately dilated ventricle. there was thinning of the inferior wall, tissue attenuation v infarct, no ischemia.   Marland Kitchen COPD (chronic obstructive pulmonary disease) ([place])   . Decreased left ventricular function   . HTN (hypertension)     SURGICAL HISTORY: History reviewed. No pertinent surgical history.  SOCIAL HISTORY: Social History   Socioeconomic History  . Marital status: Married    Spouse name: Not on file  . Number of children: Not on file  . Years of education: Not on file  . Highest education level: Not on file  Occupational History  . Not on file  Tobacco Use  . Smoking status: Former Research scientist (life sciences)  . Smokeless tobacco: Never Used  Substance and Sexual Activity  . Alcohol use: Not Currently  . Drug use: Not Currently  . Sexual activity: Not on file  Other Topics Concern  . Not on file  Social History Narrative   Lives in [place] with wife; quit smoking > 20 y; no alcohol; son-Ray lives 5-6 miles/in [place]. Worked in Air traffic controller; no asbestos exposure. Walks independently.    Social Determinants of Health   Financial Resource Strain:   . Difficulty of Paying Living Expenses:   Food Insecurity:   . Worried About Charity fundraiser in the Last Year:   . Arboriculturist in the Last Year:   Transportation Needs:   . Film/video editor (Medical):   Marland Kitchen Lack of Transportation (Non-Medical):   Physical Activity:   . Days of Exercise per Week:   . Minutes of Exercise per Session:   Stress:   . Feeling of Stress :   Social Connections:   . Frequency of Communication with Friends and Family:   . Frequency of Social Gatherings with Friends  and Family:   . Attends Religious Services:   . Active Member of Clubs or Organizations:   . Attends Archivist Meetings:   Marland Kitchen Marital Status:   Intimate Partner Violence:   . Fear of Current or Ex-Partner:   . Emotionally Abused:   Marland Kitchen Physically Abused:   .  Sexually Abused:     FAMILY HISTORY: Family History  Problem Relation Age of Onset  . Heart attack Brother     ALLERGIES:  has No Known Allergies.  MEDICATIONS:  Current Facility-Administered Medications  Medication Dose Route Frequency Provider Last Rate Last Admin  . acetaminophen (TYLENOL) tablet 650 mg  650 mg Oral Q6H PRN Ivor Costa, MD   650 mg at 10/04/19 2047  . albuterol (PROVENTIL) (2.5 MG/3ML) 0.083% nebulizer solution 3 mL  3 mL Inhalation Q4H PRN Ivor Costa, MD   3 mL at 10/04/19 2047  . amLODipine (NORVASC) tablet 10 mg  10 mg Oral Daily Ivor Costa, MD   10 mg at 10/05/19 1026  . aspirin EC tablet 81 mg  81 mg Oral Daily Ivor Costa, MD   81 mg at 10/05/19 1027  . azithromycin (ZITHROMAX) 500 mg in sodium chloride 0.9 % 250 mL IVPB  500 mg Intravenous Q24H Ivor Costa, MD 250 mL/hr at 10/05/19 1515 500 mg at 10/05/19 1515  . cefTRIAXone (ROCEPHIN) 1 g in sodium chloride 0.9 % 100 mL IVPB  1 g Intravenous Q24H Ivor Costa, MD 200 mL/hr at 10/05/19 1506 1 g at 10/05/19 1506  . dextromethorphan-guaiFENesin (MUCINEX DM) 30-600 MG per 12 hr tablet 1 tablet  1 tablet Oral BID PRN Ivor Costa, MD   1 tablet at 10/04/19 2047  . feeding supplement (ENSURE ENLIVE) (ENSURE ENLIVE) liquid 237 mL  237 mL Oral BID BM Ivor Costa, MD   237 mL at 10/05/19 1335  . heparin injection 5,000 Units  5,000 Units Subcutaneous Q8H Ivor Costa, MD   Stopped at 10/05/19 1337  . hydrALAZINE (APRESOLINE) injection 5 mg  5 mg Intravenous Q2H PRN Ivor Costa, MD      . levothyroxine (SYNTHROID) tablet 50 mcg  50 mcg Oral Daily Ivor Costa, MD   50 mcg at 10/05/19 1027  . lisinopril (ZESTRIL) tablet 10 mg  10 mg Oral Daily Ivor Costa, MD   10 mg at 10/05/19 1027  . ondansetron (ZOFRAN) injection 4 mg  4 mg Intravenous Q8H PRN Ivor Costa, MD      . rosuvastatin (CRESTOR) tablet 10 mg  10 mg Oral QHS Ivor Costa, MD   10 mg at 10/04/19 2326      .  PHYSICAL EXAMINATION:  Vitals:   10/05/19 0730 10/05/19 1413   BP: (!) 116/45 (!) 107/47  Pulse: 94 91  Resp: 17 18  Temp: 98.7 F (37.1 C) (!) 97.5 F (36.4 C)  SpO2: 92% 90%   Filed Weights   10/04/19 1042  Weight: 123 lb 14.4 oz (56.2 kg)    Physical Exam  Constitutional: He is oriented to person, place, and time.  Frail-appearing Caucasian male patient; resting in bed comfortably.  Accompanied by his son/family.  HENT:  Head: Normocephalic and atraumatic.  Mouth/Throat: Oropharynx is clear and moist. No oropharyngeal exudate.  Eyes: Pupils are equal, round, and reactive to light.  Cardiovascular: Normal rate and regular rhythm.  Pulmonary/Chest: No respiratory distress. He has no wheezes.  Decreased breath sounds on the right side compared to left.  Abdominal: Soft. Bowel sounds are normal. He exhibits no  distension and no mass. There is no abdominal tenderness. There is no rebound and no guarding.  Musculoskeletal:        General: No tenderness or edema. Normal range of motion.     Cervical back: Normal range of motion and neck supple.  Neurological: He is alert and oriented to person, place, and time.  Skin: Skin is warm.  Psychiatric: Affect normal.   LABORATORY DATA:  I have reviewed the data as listed Lab Results  Component Value Date   WBC 31.2 (H) 10/05/2019   HGB 10.0 (L) 10/05/2019   HCT 31.0 (L) 10/05/2019   MCV 90.4 10/05/2019   PLT 397 10/05/2019   Recent Labs    10/04/19 1030 10/05/19 0446  NA 136 140  K 4.3 4.3  CL 103 108  CO2 23 23  GLUCOSE 120* 83  BUN 25* 24*  CREATININE 0.83 0.90  CALCIUM 8.6* 8.7*  GFRNONAA >60 >60  GFRAA >60 >60  PROT 6.5  --   ALBUMIN 2.6*  --   AST 19  --   ALT 12  --   ALKPHOS 69  --   BILITOT 0.6  --     RADIOGRAPHIC STUDIES: I have personally reviewed the radiological images as listed and agreed with the findings in the report. DG Chest 2 View  Result Date: 10/04/2019 CLINICAL DATA:  Shortness of breath EXAM: CHEST - 2 VIEW COMPARISON:  06/12/2006 FINDINGS:  Cardiac shadow is within normal limits. Left lung is hyperinflated but clear. There is a pleural based mass lesion identified along the lateral chest wall on the right which measures 9.5 cm in greatest dimension. There are destructive changes involving the second, third and fourth ribs consistent with local invasion. Associated parenchymal density is noted likely related to lymphangitic spread in the upper lobe. No sizable effusion is seen. IMPRESSION: Pleural based mass lesion consistent with pulmonary neoplasm with erosive changes involving the second, third and fourth ribs as well as likely lymphangitic spread within the right upper lobe. CT of the chest is recommended for further evaluation. Electronically Signed   By: Inez Catalina M.D.   On: 10/04/2019 11:45   CT Angio Chest PE W and/or Wo Contrast  Result Date: 10/04/2019 CLINICAL DATA:  Pleural mass with bony destruction on recent chest x-ray, shortness of breath EXAM: CT ANGIOGRAPHY CHEST WITH CONTRAST TECHNIQUE: Multidetector CT imaging of the chest was performed using the standard protocol during bolus administration of intravenous contrast. Multiplanar CT image reconstructions and MIPs were obtained to evaluate the vascular anatomy. CONTRAST:  75mL OMNIPAQUE IOHEXOL 350 MG/ML SOLN COMPARISON:  Chest x-ray from earlier in the same day FINDINGS: Cardiovascular: Thoracic aorta and its branches demonstrate atherosclerotic calcifications. No aneurysm or dissection is seen. No cardiac enlargement is noted. Coronary calcifications are noted. The pulmonary artery shows a normal branching pattern without intraluminal filling defect to suggest pulmonary embolism. Mediastinum/Nodes: Thoracic inlet is within normal limits. No sizable hilar or mediastinal adenopathy is noted. The esophagus as visualized is within normal limits. Lungs/Pleura: Left lung demonstrates emphysematous change and apical blebs. No focal infiltrate or effusion is seen. Small 3 mm nodule is  noted in the lateral aspect of the left lower lobe best seen on image number 104 of series 6. No other definitive nodules are seen. In the right upper lobe, there is a pleural base mass lesion with chest wall invasion which measures at least 12.8 x 9.0 cm. There are erosive changes of the second third and fourth ribs  related to the underlying mass lesion. Additionally there are invasion into the subscapularis rib region. Diffuse lymphangitic spread of neoplasm is noted throughout the right upper lobe similar to that seen on prior plain film examination. Calcified granuloma is noted in the right middle lobe. Large right-sided pleural effusion is seen. Upper Abdomen: Large cystic lesion is noted in the left kidney measuring 5 cm most consistent with a simple cyst. Smaller cysts are noted within the upper pole of the left kidney. Nonobstructing 4 mm stone is noted in the upper pole of the right kidney. Spleen is within normal limits. The liver and gallbladder are unremarkable. Focal soft tissue mass lesion is noted which appears to arise from the head of the pancreas. It envelops the right hepatic artery and measures approximately 6.9 x 5.1 cm. Bilateral adrenal lesions are noted suspicious for metastatic disease. Largest of these lies on the right measuring 3.1 cm. Musculoskeletal: Resorption of the second third and fourth ribs on the right is seen related to the underlying pleural based mass lesion. An additional pleural based lesion involving the right chest wall is noted anteriorly best seen on image number 231 of series 5. This measures approximately 2.8 cm and causes some destruction of the anterior aspect of the right fifth rib. No other bony destructive lesions are seen. Review of the MIP images confirms the above findings. IMPRESSION: Large pleural base mass with right chest wall invasion and resorption of the second third fourth and fifth ribs in varying amounts. Associated right-sided pleural effusion is  noted as well as significant lymphangitic spread of neoplasm in the right upper lobe. PET-CT in further workup is recommended as clinically indicated. Bilateral adrenal lesions are noted suspicious for metastatic disease. There is a soft tissue mass lesion which appears to arise from the head of the pancreas superiorly. This may represent metastatic disease although the possibility of a pancreatic primary deserves consideration. No evidence of pulmonary emboli. Aortic Atherosclerosis (ICD10-I70.0) and Emphysema (ICD10-J43.9). Electronically Signed   By: Inez Catalina M.D.   On: 10/04/2019 13:01    Malignant neoplasm of upper lobe of right lung [facility]) #85 year old male patient history of CHF; is currently admitted to the hospital for shortness of breath/chest wall pain.  #Worsening shortness of breath chest wall pain/CT scan suggestive of right lung-pleural-based metastases; adrenal metastases-very concerning for lung primary malignancy.  #Ischemic cardiomyopathy-currently compensated.  #Leukocytosis-predominant neutrophilia; likely paraneoplastic/underlying malignancy.  Question infection.  Agree with empiric antibiotics.   Recommendations:  #Recommend CT-guided biopsy of the right pleural based chest wall mass.  Will discuss with interventional radiology morning.  Recommend n.p.o. hold p.m. dose of subcu heparin  # # I reviewed the blood work- with the patient in detail; also reviewed the imaging independently [as summarized above]; and with the patient in detail.  Discussed with patient's son, Jeanell Sparrow in detail.  My contact information was given.  Thank you Dr.Amin for allowing me to participate in the care of your pleasant patient. Please do not hesitate to contact me with questions or concerns in the interim.  Discussed with Dr. Reesa Chew.   All questions were answered. The patient knows to call the clinic with any problems, questions or concerns.    Cammie Sickle, MD 10/05/2019 6:13  PM

## 2019-10-05 NOTE — Progress Notes (Signed)
PROGRESS NOTE    Donald Gibbs  TJQ:[id number] DOB: 05-16-1934 DOA: 10/04/2019 PCP: System, Pcp Not In   Brief Narrative:  Donald Gibbs is a 85 y.o. male with medical history significant of hypertension, hyperlipidemia, COPD, hypothyroidism, sCHF with EF of 40%, who presents with shortness of breath.  Patient has been having shortness of breath for almost 3 weeks.  He also reports right-sided chest pain, right shoulder pain, and mild cough.  He has 20 pounds of weight loss recently.  Found to have leukocytosis and CT chest with large pleural-based mass with chest wall invasion, bilateral adrenal lesions suspicious for metastatic disease and a small mass in the head of pancreas.  Oncology was consulted and is going for IR guided biopsy tomorrow.  Subjective: Patient has no new complaints today.  His shortness of breath or seems improving.  He was accompanied by his son in the room.  Asking for food and another blanket.  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 Patient stop smoking long time ago.  Used to work with dyes. Oncology is following-appreciate their recommendations. -IR guided biopsy tomorrow. -CA 19-9-results pending. -N.p.o. after midnight for possible procedure tomorrow.  Leukocytosis.  Most likely reactive secondary to malignancy.  Patient remained afebrile.  Obstructive pneumonia can be a possibility. -Blood cultures negative so  far. -We will check procalcitonin. -Continue with ceftriaxone and azithromycin for now-we will discontinue if procalcitonin negative.  COPD with chronic bronchitis ([place]).  No wheezing. -As needed albuterol and Mucinex  Hypertension.  Blood pressure within goal. -Continue home dose of amlodipine and lisinopril. -Continue holding HCTZ as he appears dry.  HLD (hyperlipidemia) -Crestor  Hypothyroidism -Synthroid  Protein-calorie malnutrition, severe ([place]) -Start Ensure -Consult to nutrition  Objective: Vitals:   10/05/19 0000 10/05/19 0001 10/05/19 0339 10/05/19 0730  BP: (!) 114/49  (!) 112/59 (!) 116/45  Pulse: 100  96 94  Resp: 16  20 17   Temp: 98.9 F (37.2 C)   98.7 F (37.1 C)  TempSrc: Oral   Oral  SpO2:  92% 92% 92%  Weight:      Height:       No intake or output data in the 24 hours ending 10/05/19 1106 Filed Weights   10/04/19 1042  Weight: 56.2 kg    Examination:  General exam: Appears calm and comfortable  Respiratory system: Clear to auscultation. Respiratory effort normal. Cardiovascular system: S1 & S2 heard, RRR. No JVD, murmurs, rubs, gallops Gastrointestinal system: Soft, nontender, nondistended, bowel sounds positive. Central nervous system: Alert and oriented. No focal neurological deficits.Symmetric 5 x 5 power. Extremities: No edema, no cyanosis, pulses intact and symmetrical. Psychiatry: Judgement and insight appear normal.    DVT prophylaxis: Heparin Code Status: Full Family Communication: Son was updated at bedside. Disposition Plan:  Status is: Inpatient  Remains inpatient appropriate because:Inpatient level of care appropriate due to severity of illness   Dispo: The patient is from: Home  Anticipated d/c is to: To be determined              Anticipated d/c date is: 2 days              Patient currently is not medically stable to d/c.  Patient is undergoing oncologic work-up.  Consultants:   Oncology  Procedures:   Antimicrobials:  Ceftriaxone A Zithromax  Data Reviewed: I have personally reviewed following labs and imaging studies  CBC: Recent Labs  Lab 10/04/19 1030 10/05/19 0446  WBC 31.8* 31.2*  NEUTROABS 29.2*  --   HGB 10.8* 10.0*  HCT 34.4* 31.0*  MCV 91.5 90.4  PLT 401* 092   Basic Metabolic Panel: Recent Labs  Lab 10/04/19 1030 10/05/19 0446  NA 136 140  K 4.3 4.3  CL 103 108  CO2 23 23  GLUCOSE 120* 83  BUN 25* 24*  CREATININE 0.83 0.90  CALCIUM 8.6* 8.7*  MG  --  1.8   GFR: Estimated Creatinine Clearance: 49.4 mL/min (by C-G formula based on SCr of 0.9 mg/dL). Liver Function Tests: Recent Labs  Lab 10/04/19 1030  AST 19  ALT 12  ALKPHOS 69  BILITOT 0.6  PROT 6.5  ALBUMIN 2.6*   No results for input(s): LIPASE, AMYLASE in the last 168 hours. No results for input(s): AMMONIA in the last 168 hours. Coagulation Profile: Recent Labs  Lab 10/04/19 1839  INR 1.1   Cardiac Enzymes: No results for input(s): CKTOTAL, CKMB, CKMBINDEX, TROPONINI in the last 168 hours. BNP (last 3 results) No results for input(s): PROBNP in the last 8760 hours. HbA1C: No results for input(s): HGBA1C in the last 72 hours. CBG: No results for input(s): GLUCAP in the last 168 hours. Lipid Profile: No results for input(s): CHOL, HDL, LDLCALC, TRIG, CHOLHDL, LDLDIRECT in the last 72 hours. Thyroid Function Tests: No results for input(s): TSH, T4TOTAL, FREET4, T3FREE, THYROIDAB in the last 72 hours. Anemia Panel: No results for input(s): VITAMINB12, FOLATE, FERRITIN, TIBC, IRON, RETICCTPCT in the last 72 hours. Sepsis Labs: No results for input(s): PROCALCITON, LATICACIDVEN in the last 168 hours.  Recent Results (from the past 240 hour(s))  SARS Coronavirus 2 by RT PCR (hospital order, performed in [facility] hospital lab) Nasopharyngeal Nasopharyngeal Swab     Status: None   Collection Time: 10/04/19  2:10 PM   Specimen: Nasopharyngeal Swab  Result Value Ref Range Status    SARS Coronavirus 2 NEGATIVE NEGATIVE Final    Comment: (NOTE) SARS-CoV-2 target nucleic acids are NOT DETECTED. The SARS-CoV-2 RNA is generally detectable in upper and lower respiratory specimens during the acute phase of infection. The lowest concentration of SARS-CoV-2 viral copies this assay can detect is 250 copies / mL. A negative result does not preclude SARS-CoV-2 infection and should not be used as the sole basis for treatment or other patient management decisions.  A negative result may occur with improper specimen collection / handling, submission of specimen other than nasopharyngeal swab, presence of viral mutation(s) within the areas targeted by this assay, and inadequate number of viral copies (<250 copies / mL). A negative result must be combined with clinical observations, patient history, and epidemiological information. Fact Sheet for Patients:   StrictlyIdeas.no Fact Sheet for Healthcare Providers: BankingDealers.co.za This test is not yet approved or cleared  by the [place] FDA and has been authorized for detection and/or diagnosis of SARS-CoV-2 by FDA under an Emergency Use Authorization (EUA).  This EUA will remain in effect (meaning this test  can be used) for the duration of the COVID-19 declaration under Section 564(b)(1) of the Act, 21 U.S.C. section 360bbb-3(b)(1), unless the authorization is terminated or revoked sooner. Performed at [facility], [address] [place], [place] [address]   CULTURE, BLOOD (ROUTINE X 2) w Reflex to ID Panel     Status: None (Preliminary result)   Collection Time: 10/04/19  6:39 PM   Specimen: BLOOD  Result Value Ref Range Status   Specimen Description BLOOD RAC  Final   Special Requests   Final    BOTTLES DRAWN AEROBIC AND ANAEROBIC Blood Culture adequate volume   Culture  Setup Time ANAEROBIC BOTTLE ONLY  Final   Culture   Final    NO GROWTH < 12  HOURS Performed at [facility], [address], [place], [place] [address]    Report Status PENDING  Incomplete  CULTURE, BLOOD (ROUTINE X 2) w Reflex to ID Panel     Status: None (Preliminary result)   Collection Time: 10/04/19  7:16 PM   Specimen: BLOOD  Result Value Ref Range Status   Specimen Description BLOOD RIGHT ANTECUBITAL  Final   Special Requests   Final    BOTTLES DRAWN AEROBIC AND ANAEROBIC Blood Culture results may not be optimal due to an inadequate volume of blood received in culture bottles   Culture   Final    NO GROWTH < 12 HOURS Performed at [facility], [address], [place], [place] [address]    Report Status PENDING  Incomplete     Radiology Studies: DG Chest 2 View  Result Date: 10/04/2019 CLINICAL DATA:  Shortness of breath EXAM: CHEST - 2 VIEW COMPARISON:  06/12/2006 FINDINGS: Cardiac shadow is within normal limits. Left lung is hyperinflated but clear. There is a pleural based mass lesion identified along the lateral chest wall on the right which measures 9.5 cm in greatest dimension. There are destructive changes involving the second, third and fourth ribs consistent with local invasion. Associated parenchymal density is noted likely related to lymphangitic spread in the upper lobe. No sizable effusion is seen. IMPRESSION: Pleural based mass lesion consistent with pulmonary neoplasm with erosive changes involving the second, third and fourth ribs as well as likely lymphangitic spread within the right upper lobe. CT of the chest is recommended for further evaluation. Electronically Signed   By: Inez Catalina M.D.   On: 10/04/2019 11:45   CT Angio Chest PE W and/or Wo Contrast  Result Date: 10/04/2019 CLINICAL DATA:  Pleural mass with bony destruction on recent chest x-ray, shortness of breath EXAM: CT ANGIOGRAPHY CHEST WITH CONTRAST TECHNIQUE: Multidetector CT imaging of the chest was performed using the standard protocol during bolus  administration of intravenous contrast. Multiplanar CT image reconstructions and MIPs were obtained to evaluate the vascular anatomy. CONTRAST:  1mL OMNIPAQUE IOHEXOL 350 MG/ML SOLN COMPARISON:  Chest x-ray from earlier in the same day FINDINGS: Cardiovascular: Thoracic aorta and its branches demonstrate atherosclerotic calcifications. No aneurysm or dissection is seen. No cardiac enlargement is noted. Coronary calcifications are noted. The pulmonary artery shows a normal branching pattern without intraluminal filling defect to suggest pulmonary embolism. Mediastinum/Nodes: Thoracic inlet is within normal limits. No sizable hilar or mediastinal adenopathy is noted. The esophagus as visualized is within normal limits. Lungs/Pleura: Left lung demonstrates emphysematous change and apical blebs. No focal infiltrate or effusion is seen. Small 3 mm nodule is noted in the lateral aspect of the left lower lobe best seen on image  number 104 of series 6. No other definitive nodules are seen. In the right upper lobe, there is a pleural base mass lesion with chest wall invasion which measures at least 12.8 x 9.0 cm. There are erosive changes of the second third and fourth ribs related to the underlying mass lesion. Additionally there are invasion into the subscapularis rib region. Diffuse lymphangitic spread of neoplasm is noted throughout the right upper lobe similar to that seen on prior plain film examination. Calcified granuloma is noted in the right middle lobe. Large right-sided pleural effusion is seen. Upper Abdomen: Large cystic lesion is noted in the left kidney measuring 5 cm most consistent with a simple cyst. Smaller cysts are noted within the upper pole of the left kidney. Nonobstructing 4 mm stone is noted in the upper pole of the right kidney. Spleen is within normal limits. The liver and gallbladder are unremarkable. Focal soft tissue mass lesion is noted which appears to arise from the head of the pancreas.  It envelops the right hepatic artery and measures approximately 6.9 x 5.1 cm. Bilateral adrenal lesions are noted suspicious for metastatic disease. Largest of these lies on the right measuring 3.1 cm. Musculoskeletal: Resorption of the second third and fourth ribs on the right is seen related to the underlying pleural based mass lesion. An additional pleural based lesion involving the right chest wall is noted anteriorly best seen on image number 231 of series 5. This measures approximately 2.8 cm and causes some destruction of the anterior aspect of the right fifth rib. No other bony destructive lesions are seen. Review of the MIP images confirms the above findings. IMPRESSION: Large pleural base mass with right chest wall invasion and resorption of the second third fourth and fifth ribs in varying amounts. Associated right-sided pleural effusion is noted as well as significant lymphangitic spread of neoplasm in the right upper lobe. PET-CT in further workup is recommended as clinically indicated. Bilateral adrenal lesions are noted suspicious for metastatic disease. There is a soft tissue mass lesion which appears to arise from the head of the pancreas superiorly. This may represent metastatic disease although the possibility of a pancreatic primary deserves consideration. No evidence of pulmonary emboli. Aortic Atherosclerosis (ICD10-I70.0) and Emphysema (ICD10-J43.9). Electronically Signed   By: Inez Catalina M.D.   On: 10/04/2019 13:01    Scheduled Meds: . amLODipine  10 mg Oral Daily  . aspirin EC  81 mg Oral Daily  . feeding supplement (ENSURE ENLIVE)  237 mL Oral BID BM  . heparin  5,000 Units Subcutaneous Q8H  . levothyroxine  50 mcg Oral Daily  . lisinopril  10 mg Oral Daily  . rosuvastatin  10 mg Oral QHS   Continuous Infusions: . azithromycin (ZITHROMAX) 500 MG IVPB (Vial-Mate [employer])    . cefTRIAXone (ROCEPHIN)  IV       LOS: 1 day   Time spent: 40 minutes.  Lorella Nimrod, MD [facility]  If 7PM-7AM, please contact night-coverage [email or web address]  10/05/2019, 11:06 AM   This record has been created using Systems analyst. Errors have been sought and corrected,but may not always be located. Such creation errors do not reflect on the standard of care.

## 2019-10-06 ENCOUNTER — Telehealth: Payer: Self-pay | Admitting: Internal Medicine

## 2019-10-06 ENCOUNTER — Inpatient Hospital Stay: Payer: Medicare HMO

## 2019-10-06 ENCOUNTER — Encounter: Payer: Self-pay | Admitting: Internal Medicine

## 2019-10-06 DIAGNOSIS — C3411 Malignant neoplasm of upper lobe, right bronchus or lung: Secondary | ICD-10-CM

## 2019-10-06 LAB — CBC
HCT: 29.7 % — ABNORMAL LOW (ref 39.0–52.0)
Hemoglobin: 9.8 g/dL — ABNORMAL LOW (ref 13.0–17.0)
MCH: 29.1 pg (ref 26.0–34.0)
MCHC: 33 g/dL (ref 30.0–36.0)
MCV: 88.1 fL (ref 80.0–100.0)
Platelets: 377 10*3/uL (ref 150–400)
RBC: 3.37 MIL/uL — ABNORMAL LOW (ref 4.22–5.81)
RDW: 14.2 % (ref 11.5–15.5)
WBC: 33.6 10*3/uL — ABNORMAL HIGH (ref 4.0–10.5)
nRBC: 0 % (ref 0.0–0.2)

## 2019-10-06 LAB — CA 19-9 (SERIAL): CA 19-9: 201 U/mL — ABNORMAL HIGH (ref 0–35)

## 2019-10-06 LAB — PROCALCITONIN: Procalcitonin: 1.06 ng/mL

## 2019-10-06 MED ORDER — HYDROCODONE-ACETAMINOPHEN 5-325 MG PO TABS: 1.0000 | ORAL_TABLET | Freq: Four times a day (QID) | ORAL | 0 refills | Status: DC | PRN

## 2019-10-06 MED ORDER — HYDROCODONE-ACETAMINOPHEN 5-325 MG PO TABS
1.0000 | ORAL_TABLET | Freq: Four times a day (QID) | ORAL | Status: DC | PRN
  Administered 2019-10-06: 14:00:00 1 via ORAL
  Filled 2019-10-06: qty 1

## 2019-10-06 MED ORDER — ENSURE ENLIVE PO LIQD: 237.0000 mL | Freq: Two times a day (BID) | ORAL | 12 refills | Status: AC

## 2019-10-06 MED ORDER — HEPARIN SODIUM (PORCINE) 5000 UNIT/ML IJ SOLN: 5000.0000 [IU] | Freq: Three times a day (TID) | INTRAMUSCULAR | Status: DC

## 2019-10-06 MED ORDER — LEVOFLOXACIN 750 MG PO TABS
750.0000 mg | ORAL_TABLET | Freq: Every day | ORAL | 0 refills | Status: AC
Start: 2019-10-06 — End: 2019-10-13

## 2019-10-06 MED ORDER — FENTANYL CITRATE (PF) 100 MCG/2ML IJ SOLN
INTRAMUSCULAR | Status: AC
  Filled 2019-10-06: qty 2

## 2019-10-06 MED ORDER — DM-GUAIFENESIN ER 30-600 MG PO TB12: 1.0000 | ORAL_TABLET | Freq: Two times a day (BID) | ORAL | 0 refills | Status: DC | PRN

## 2019-10-06 MED ORDER — MIDAZOLAM HCL 2 MG/2ML IJ SOLN
INTRAMUSCULAR | Status: AC
  Filled 2019-10-06: qty 2

## 2019-10-06 MED ORDER — SODIUM CHLORIDE 0.9 % IV SOLN
INTRAVENOUS | Status: DC | PRN
  Administered 2019-10-06: 10 mL/h via INTRAVENOUS

## 2019-10-06 MED ORDER — HYDROMORPHONE HCL 1 MG/ML IJ SOLN
1.0000 mg | Freq: Once | INTRAMUSCULAR | Status: AC
  Administered 2019-10-06: 13:00:00 1 mg via INTRAVENOUS
  Filled 2019-10-06: qty 1

## 2019-10-06 NOTE — Procedures (Signed)
US biopsy of right chest wall invasion from RUL lung mass  Complications:  None  Blood Loss: none  See dictation in canopy pacs  [facility] for discharge if stable after two hours

## 2019-10-06 NOTE — Telephone Encounter (Signed)
On 5/24-spoke to patient's son regarding the follow-up plan post biopsy.  Also recommend radiation oncology consultation.  M-schedule appointment on June 1st at 2:30- MD; labs- cbc/cmp; Dx; lung cancer. Please also make a radiation oncology referral; if possible in the same day.

## 2019-10-06 NOTE — Progress Notes (Signed)
[facility] PROGRESS NOTE  Patient: Donald Gibbs [id number]:[id number]   PCP: System, Pcp Not In DOB: 1934-10-16   DOA: 10/04/2019   DOS: 10/06/2019    Assessment and plan: Patient was discharged earlier by my partner. Patient's pharmacy closed and is unable to receive pain medication. On-call provider requested me to provide prescription for overnight coverage for pain medication at the pharmacy is open tomorrow. 4 tablets of Norco were prescribed to [facility] which is requested pharmacy by the patient. PDMP was reviewed.  Author: Berle Mull, MD [facility] 10/06/2019 7:19 PM   If 7PM-7AM, please contact night-coverage at [email or web address]

## 2019-10-06 NOTE — H&P (Signed)
Chief Complaint: Pleural mass  Referring Physician(s): Cammie Sickle, MD  Supervising Physician: Inez Catalina  Patient Status: [place] - In-pt  History of Present Illness: ALCARIO Gibbs is a 85 y.o. male who presented to the ED with right chest pain and shortness of breath.  CT scan emergency room showed-pleural-based mass; mild pleural effusion; adrenal lesions concerning for metastases.  He was admitted for pain control and for evaluation by Oncology.  We are asked to perform image guided biopsy of the mass for tissue diagnosis and planning treatment.  He is NPO. He reports fatigue and weight loss. No nausea/vomiting. No Fever/chills. ROS negative.        Past Medical History:  Diagnosis Date  . Cardiomyopathy    secondary. echo-EF 25-35%. Adensoine myoview- EF 31% w/a moderately dilated ventricle. there was thinning of the inferior wall, tissue attenuation v infarct, no ischemia.   Marland Kitchen COPD (chronic obstructive pulmonary disease) ([place])   . Decreased left ventricular function   . HTN (hypertension)     History reviewed. No pertinent surgical history.  Allergies: Patient has no known allergies.  Medications: Prior to Admission medications   Medication Sig Start Date End Date Taking? Authorizing Provider  amLODipine (NORVASC) 10 MG tablet Take 10 mg by mouth daily. 06/10/19  Yes [provider]  aspirin 81 MG EC tablet Take by mouth.   Yes [provider]  hydrochlorothiazide (MICROZIDE) 12.5 MG capsule Take 12.5 mg by mouth daily. 06/10/19  Yes [provider]  levothyroxine (SYNTHROID) 50 MCG tablet Take 50 mcg by mouth daily. 06/10/19  Yes [provider]  lisinopril (ZESTRIL) 10 MG tablet Take 10 mg by mouth daily. 06/10/19  Yes [provider]  rosuvastatin (CRESTOR) 10 MG tablet Take 10 mg by mouth at bedtime. 06/10/19  Yes [provider]     Family History  Problem Relation Age of Onset  .  Heart attack Brother     Social History   Socioeconomic History  . Marital status: Married    Spouse name: Not on file  . Number of children: Not on file  . Years of education: Not on file  . Highest education level: Not on file  Occupational History  . Not on file  Tobacco Use  . Smoking status: Former Research scientist (life sciences)  . Smokeless tobacco: Never Used  Substance and Sexual Activity  . Alcohol use: Not Currently  . Drug use: Not Currently  . Sexual activity: Not on file  Other Topics Concern  . Not on file  Social History Narrative   Lives in [place] with wife; quit smoking > 20 y; no alcohol; son-Ray lives 5-6 miles/in [place]. Worked in Air traffic controller; no asbestos exposure. Walks independently.    Social Determinants of Health   Financial Resource Strain:   . Difficulty of Paying Living Expenses:   Food Insecurity:   . Worried About Charity fundraiser in the Last Year:   . Arboriculturist in the Last Year:   Transportation Needs:   . Film/video editor (Medical):   Marland Kitchen Lack of Transportation (Non-Medical):   Physical Activity:   . Days of Exercise per Week:   . Minutes of Exercise per Session:   Stress:   . Feeling of Stress :   Social Connections:   . Frequency of Communication with Friends and Family:   . Frequency of Social Gatherings with Friends and Family:   . Attends Religious Services:   . Active Member  of Clubs or Organizations:   . Attends Archivist Meetings:   Marland Kitchen Marital Status:      Review of Systems: A 12 point ROS discussed and pertinent positives are indicated in the HPI above.  All other systems are negative.  Review of Systems  Vital Signs: BP (!) 135/57   Pulse 91   Temp 98.2 F (36.8 C) (Oral)   Resp 18   Ht 5\' 9"  (1.753 m)   Wt 56.2 kg   SpO2 95%   BMI 18.30 kg/m   Physical Exam Vitals reviewed.  Constitutional:      Comments: Thin, frail, elderly  HENT:     Head: Normocephalic and atraumatic.  Eyes:      Extraocular Movements: Extraocular movements intact.  Cardiovascular:     Rate and Rhythm: Normal rate and regular rhythm.  Pulmonary:     Effort: Pulmonary effort is normal. No respiratory distress.     Comments: Diminished breath sounds right apex. Abdominal:     General: There is no distension.     Palpations: Abdomen is soft.     Tenderness: There is no abdominal tenderness.  Musculoskeletal:        General: Normal range of motion.     Cervical back: Normal range of motion.  Skin:    General: Skin is warm and dry.  Neurological:     General: No focal deficit present.     Mental Status: He is alert and oriented to person, place, and time.  Psychiatric:        Mood and Affect: Mood normal.        Behavior: Behavior normal.        Thought Content: Thought content normal.        Judgment: Judgment normal.     Imaging: DG Chest 2 View  Result Date: 10/04/2019 CLINICAL DATA:  Shortness of breath EXAM: CHEST - 2 VIEW COMPARISON:  06/12/2006 FINDINGS: Cardiac shadow is within normal limits. Left lung is hyperinflated but clear. There is a pleural based mass lesion identified along the lateral chest wall on the right which measures 9.5 cm in greatest dimension. There are destructive changes involving the second, third and fourth ribs consistent with local invasion. Associated parenchymal density is noted likely related to lymphangitic spread in the upper lobe. No sizable effusion is seen. IMPRESSION: Pleural based mass lesion consistent with pulmonary neoplasm with erosive changes involving the second, third and fourth ribs as well as likely lymphangitic spread within the right upper lobe. CT of the chest is recommended for further evaluation. Electronically Signed   By: Inez Catalina M.D.   On: 10/04/2019 11:45   CT Angio Chest PE W and/or Wo Contrast  Result Date: 10/04/2019 CLINICAL DATA:  Pleural mass with bony destruction on recent chest x-ray, shortness of breath EXAM: CT ANGIOGRAPHY  CHEST WITH CONTRAST TECHNIQUE: Multidetector CT imaging of the chest was performed using the standard protocol during bolus administration of intravenous contrast. Multiplanar CT image reconstructions and MIPs were obtained to evaluate the vascular anatomy. CONTRAST:  16mL OMNIPAQUE IOHEXOL 350 MG/ML SOLN COMPARISON:  Chest x-ray from earlier in the same day FINDINGS: Cardiovascular: Thoracic aorta and its branches demonstrate atherosclerotic calcifications. No aneurysm or dissection is seen. No cardiac enlargement is noted. Coronary calcifications are noted. The pulmonary artery shows a normal branching pattern without intraluminal filling defect to suggest pulmonary embolism. Mediastinum/Nodes: Thoracic inlet is within normal limits. No sizable hilar or mediastinal adenopathy is noted. The esophagus as  visualized is within normal limits. Lungs/Pleura: Left lung demonstrates emphysematous change and apical blebs. No focal infiltrate or effusion is seen. Small 3 mm nodule is noted in the lateral aspect of the left lower lobe best seen on image number 104 of series 6. No other definitive nodules are seen. In the right upper lobe, there is a pleural base mass lesion with chest wall invasion which measures at least 12.8 x 9.0 cm. There are erosive changes of the second third and fourth ribs related to the underlying mass lesion. Additionally there are invasion into the subscapularis rib region. Diffuse lymphangitic spread of neoplasm is noted throughout the right upper lobe similar to that seen on prior plain film examination. Calcified granuloma is noted in the right middle lobe. Large right-sided pleural effusion is seen. Upper Abdomen: Large cystic lesion is noted in the left kidney measuring 5 cm most consistent with a simple cyst. Smaller cysts are noted within the upper pole of the left kidney. Nonobstructing 4 mm stone is noted in the upper pole of the right kidney. Spleen is within normal limits. The liver and  gallbladder are unremarkable. Focal soft tissue mass lesion is noted which appears to arise from the head of the pancreas. It envelops the right hepatic artery and measures approximately 6.9 x 5.1 cm. Bilateral adrenal lesions are noted suspicious for metastatic disease. Largest of these lies on the right measuring 3.1 cm. Musculoskeletal: Resorption of the second third and fourth ribs on the right is seen related to the underlying pleural based mass lesion. An additional pleural based lesion involving the right chest wall is noted anteriorly best seen on image number 231 of series 5. This measures approximately 2.8 cm and causes some destruction of the anterior aspect of the right fifth rib. No other bony destructive lesions are seen. Review of the MIP images confirms the above findings. IMPRESSION: Large pleural base mass with right chest wall invasion and resorption of the second third fourth and fifth ribs in varying amounts. Associated right-sided pleural effusion is noted as well as significant lymphangitic spread of neoplasm in the right upper lobe. PET-CT in further workup is recommended as clinically indicated. Bilateral adrenal lesions are noted suspicious for metastatic disease. There is a soft tissue mass lesion which appears to arise from the head of the pancreas superiorly. This may represent metastatic disease although the possibility of a pancreatic primary deserves consideration. No evidence of pulmonary emboli. Aortic Atherosclerosis (ICD10-I70.0) and Emphysema (ICD10-J43.9). Electronically Signed   By: Inez Catalina M.D.   On: 10/04/2019 13:01    Labs:  CBC: Recent Labs    10/04/19 1030 10/05/19 0446 10/06/19 0515  WBC 31.8* 31.2* 33.6*  HGB 10.8* 10.0* 9.8*  HCT 34.4* 31.0* 29.7*  PLT 401* 397 377    COAGS: Recent Labs    10/04/19 1839  INR 1.1  APTT 35    BMP: Recent Labs    10/04/19 1030 10/05/19 0446  NA 136 140  K 4.3 4.3  CL 103 108  CO2 23 23  GLUCOSE 120*  83  BUN 25* 24*  CALCIUM 8.6* 8.7*  CREATININE 0.83 0.90  GFRNONAA >60 >60  GFRAA >60 >60    LIVER FUNCTION TESTS: Recent Labs    10/04/19 1030  BILITOT 0.6  AST 19  ALT 12  ALKPHOS 69  PROT 6.5  ALBUMIN 2.6*    TUMOR MARKERS: No results for input(s): AFPTM, CEA, CA199, CHROMGRNA in the last 8760 hours.  Assessment and Plan:  Pleural-based mass and adrenal lesions concerning for metastases.  Will proceed with image guided biopsy today by Dr. Golden Circle.  Risks and benefits of pleural mass biopsy was discussed with the patient and/or patient's family including, but not limited to bleeding, infection, damage to adjacent structures or low yield requiring additional tests.  All of the questions were answered and there is agreement to proceed.  Consent signed and in chart.  Thank you for this interesting consult.  I greatly enjoyed meeting Thamas R Westfall and look forward to participating in their care.  A copy of this report was sent to the requesting provider on this date.  Electronically Signed: Murrell Redden, PA-C   10/06/2019, 9:58 AM      I spent a total of 40 Minutes in face to face in clinical consultation, greater than 50% of which was counseling/coordinating care for pleural mass biopsy.

## 2019-10-06 NOTE — Progress Notes (Signed)
Initial Nutrition Assessment  DOCUMENTATION CODES:   Severe malnutrition in context of chronic illness, Underweight  INTERVENTION:  Recommend liberalizing diet to regular.  Recommend increasing to Ensure Enlive po TID, each supplement provides 350 kcal and 20 grams of protein.  Patient with discharge order so unable to make any changes to orders at this time.  Provide Magic cup TID with meals, each supplement provides 290 kcal and 9 grams of protein.  Encouraged intake of small, frequent meals. Discussed choosing foods high in calories and protein. Provided "High-Calorie, High-Protein Nutrition Therapy" handout from the Academy of Nutrition and Dietetics.  NUTRITION DIAGNOSIS:   Severe Malnutrition related to chronic illness(COPD, CHF, possible malignancy) as evidenced by severe fat depletion, severe muscle depletion.  GOAL:   Patient will meet greater than or equal to 90% of their needs  MONITOR:   PO intake, Supplement acceptance, Labs, Weight trends, I & O's, Skin  REASON FOR ASSESSMENT:   Consult Assessment of nutrition requirement/status  ASSESSMENT:   85 year old male with PMHx of COPD, HTN, HLD, hypothyroidism, CHF who was admitted with shortness of breath and chest wall pain found to have malignant neoplasm of right upper lobe, pleural mass, bilateral adrenal lesions, and pancreatic mass.   -Per Oncology notes findings are concerning for lung primary malignancy. -Patient s/p US biopsy of right chest wall invasion from RUL lung mass today.  Met with patient and his family member at bedside. Patient reports he has had a decreased appetite for a while now. He reports he tries to eat 2-3 meals per day but he eats small amounts now. For breakfast he has a glass of orange juice and a steak and egg biscuit. For lunch he may have a hamburger, hotdog, or a sandwich. For dinner he may have some chicken noodle soup. Patient was NPO this morning at breakfast for his biopsy today.  He was only able to have a few grapes from his lunch tray. He reports he has been drinking the Ensure and tolerating well. Encouraged him to drink 2-3 daily between meals. He is also amenable to trying [employer].   Patient reports his UBW was 147 lbs. He is unsure when he began losing weight. No recent weight history in chart to trend. He is currently 56.2 kg (123.9 lbs).  Medications reviewed and include: levothyroxine, azithromycin, ceftriaxone.  Labs reviewed.  NUTRITION - FOCUSED PHYSICAL EXAM:    Most Recent Value  Orbital Region  Severe depletion  Upper Arm Region  Severe depletion  Thoracic and Lumbar Region  Severe depletion  Buccal Region  Severe depletion  Temple Region  Severe depletion  Clavicle Bone Region  Severe depletion  Clavicle and Acromion Bone Region  Severe depletion  Scapular Bone Region  Unable to assess  Dorsal Hand  Severe depletion  Patellar Region  Severe depletion  Anterior Thigh Region  Severe depletion  Posterior Calf Region  Severe depletion  Edema (RD Assessment)  None  Hair  Reviewed  Eyes  Reviewed  Mouth  Reviewed  Skin  Reviewed  Nails  Reviewed     Diet Order:   Diet Order            Diet Heart Room service appropriate? Yes; Fluid consistency: Thin  Diet effective now        Diet - low sodium heart healthy             EDUCATION NEEDS:   Education needs have been addressed  Skin:  Skin Assessment:  Skin Integrity Issues: Skin Integrity Issues:: Unstageable Unstageable: sacrum (0.3cm scabbed wound)  Last BM:  10/05/2019 per chart  Height:   Ht Readings from Last 1 Encounters:  10/04/19 5' 9"  (1.753 m)   Weight:   Wt Readings from Last 1 Encounters:  10/04/19 56.2 kg   Ideal Body Weight:  72.7 kg  BMI:  Body mass index is 18.3 kg/m.  Estimated Nutritional Needs:   Kcal:  1700-1900  Protein:  85-95 grams  Fluid:  1.7-1.9 L/day  Donald Barnacle, MS, RD, LDN Pager number available on Amion

## 2019-10-06 NOTE — Progress Notes (Signed)
[place]   DOB:07-20-1934   ZO#:[id number]    Subjective: No acute events overnight.  Patient has no fevers or chills.  No nausea no vomiting.  Chest wall pain is controlled.  Eager to go home.  Objective:  Vitals:   10/05/19 1413 10/06/19 0800  BP: (!) 107/47 (!) 135/57  Pulse: 91   Resp: 18 18  Temp: (!) 97.5 F (36.4 C) 98.2 F (36.8 C)  SpO2: 90% 95%     Intake/Output Summary (Last 24 hours) at 10/06/2019 0844 Last data filed at 10/05/2019 2332 Gross per 24 hour  Intake 590 ml  Output --  Net 590 ml    Physical Exam  Constitutional: He is oriented to person, place, and time and well-developed, well-nourished, and in no distress.  Frail-appearing Caucasian male patient.  Alone.  Resting in bed.  HENT:  Head: Normocephalic and atraumatic.  Mouth/Throat: Oropharynx is clear and moist. No oropharyngeal exudate.  Eyes: Pupils are equal, round, and reactive to light.  Cardiovascular: Normal rate and regular rhythm.  Pulmonary/Chest: No respiratory distress. He has no wheezes.  Decreased air entry bilaterally.  Abdominal: Soft. Bowel sounds are normal. He exhibits no distension and no mass. There is no abdominal tenderness. There is no rebound and no guarding.  Musculoskeletal:        General: No tenderness or edema. Normal range of motion.     Cervical back: Normal range of motion and neck supple.  Neurological: He is alert and oriented to person, place, and time.  Skin: Skin is warm.  Psychiatric: Affect normal.     Labs:  Lab Results  Component Value Date   WBC 33.6 (H) 10/06/2019   HGB 9.8 (L) 10/06/2019   HCT 29.7 (L) 10/06/2019   MCV 88.1 10/06/2019   PLT 377 10/06/2019   NEUTROABS 29.2 (H) 10/04/2019    Lab Results  Component Value Date   NA 140 10/05/2019   K 4.3 10/05/2019   CL 108 10/05/2019   CO2 23 10/05/2019    Studies:  DG Chest 2 View  Result Date: 10/04/2019 CLINICAL DATA:  Shortness of breath EXAM: CHEST - 2 VIEW COMPARISON:   06/12/2006 FINDINGS: Cardiac shadow is within normal limits. Left lung is hyperinflated but clear. There is a pleural based mass lesion identified along the lateral chest wall on the right which measures 9.5 cm in greatest dimension. There are destructive changes involving the second, third and fourth ribs consistent with local invasion. Associated parenchymal density is noted likely related to lymphangitic spread in the upper lobe. No sizable effusion is seen. IMPRESSION: Pleural based mass lesion consistent with pulmonary neoplasm with erosive changes involving the second, third and fourth ribs as well as likely lymphangitic spread within the right upper lobe. CT of the chest is recommended for further evaluation. Electronically Signed   By: Inez Catalina M.D.   On: 10/04/2019 11:45   CT Angio Chest PE W and/or Wo Contrast  Result Date: 10/04/2019 CLINICAL DATA:  Pleural mass with bony destruction on recent chest x-ray, shortness of breath EXAM: CT ANGIOGRAPHY CHEST WITH CONTRAST TECHNIQUE: Multidetector CT imaging of the chest was performed using the standard protocol during bolus administration of intravenous contrast. Multiplanar CT image reconstructions and MIPs were obtained to evaluate the vascular anatomy. CONTRAST:  58mL OMNIPAQUE IOHEXOL 350 MG/ML SOLN COMPARISON:  Chest x-ray from earlier in the same day FINDINGS: Cardiovascular: Thoracic aorta and its branches demonstrate atherosclerotic calcifications. No aneurysm or dissection is seen. No cardiac  enlargement is noted. Coronary calcifications are noted. The pulmonary artery shows a normal branching pattern without intraluminal filling defect to suggest pulmonary embolism. Mediastinum/Nodes: Thoracic inlet is within normal limits. No sizable hilar or mediastinal adenopathy is noted. The esophagus as visualized is within normal limits. Lungs/Pleura: Left lung demonstrates emphysematous change and apical blebs. No focal infiltrate or effusion is seen.  Small 3 mm nodule is noted in the lateral aspect of the left lower lobe best seen on image number 104 of series 6. No other definitive nodules are seen. In the right upper lobe, there is a pleural base mass lesion with chest wall invasion which measures at least 12.8 x 9.0 cm. There are erosive changes of the second third and fourth ribs related to the underlying mass lesion. Additionally there are invasion into the subscapularis rib region. Diffuse lymphangitic spread of neoplasm is noted throughout the right upper lobe similar to that seen on prior plain film examination. Calcified granuloma is noted in the right middle lobe. Large right-sided pleural effusion is seen. Upper Abdomen: Large cystic lesion is noted in the left kidney measuring 5 cm most consistent with a simple cyst. Smaller cysts are noted within the upper pole of the left kidney. Nonobstructing 4 mm stone is noted in the upper pole of the right kidney. Spleen is within normal limits. The liver and gallbladder are unremarkable. Focal soft tissue mass lesion is noted which appears to arise from the head of the pancreas. It envelops the right hepatic artery and measures approximately 6.9 x 5.1 cm. Bilateral adrenal lesions are noted suspicious for metastatic disease. Largest of these lies on the right measuring 3.1 cm. Musculoskeletal: Resorption of the second third and fourth ribs on the right is seen related to the underlying pleural based mass lesion. An additional pleural based lesion involving the right chest wall is noted anteriorly best seen on image number 231 of series 5. This measures approximately 2.8 cm and causes some destruction of the anterior aspect of the right fifth rib. No other bony destructive lesions are seen. Review of the MIP images confirms the above findings. IMPRESSION: Large pleural base mass with right chest wall invasion and resorption of the second third fourth and fifth ribs in varying amounts. Associated right-sided  pleural effusion is noted as well as significant lymphangitic spread of neoplasm in the right upper lobe. PET-CT in further workup is recommended as clinically indicated. Bilateral adrenal lesions are noted suspicious for metastatic disease. There is a soft tissue mass lesion which appears to arise from the head of the pancreas superiorly. This may represent metastatic disease although the possibility of a pancreatic primary deserves consideration. No evidence of pulmonary emboli. Aortic Atherosclerosis (ICD10-I70.0) and Emphysema (ICD10-J43.9). Electronically Signed   By: Inez Catalina M.D.   On: 10/04/2019 13:01    Malignant neoplasm of upper lobe of right lung [facility]) #85 year old male patient history of CHF; is currently admitted to the hospital for shortness of breath/chest wall pain.  #Worsening shortness of breath chest wall pain/CT scan suggestive of right lung-pleural-based metastases; adrenal metastases-very concerning for lung primary malignancy; awaiting evaluation with IR/biopsy this morning.  #Ischemic cardiomyopathy-currently compensated.  Stable  #Leukocytosis-predominant neutrophilia; likely paraneoplastic/underlying malignancy; less likely infection.  Stable;  procalcitonin normal.  #Disposition: Patient discharged home post biopsy if clinically stable.;  Will make appointment for follow-up in the next week or so/to review the results of the biopsy.  Patient also needs a PET scan outpatient.  Cammie Sickle, MD 10/06/2019  8:44 AM

## 2019-10-06 NOTE — Discharge Summary (Signed)
Physician Discharge Summary  KASHDEN DEBOY SJG:[id number] DOB: 04/09/1935 DOA: 10/04/2019  PCP: System, Pcp Not In  Admit date: 10/04/2019 Discharge date: 10/06/2019  Admitted From: Home Disposition: Home  Recommendations for Outpatient Follow-up:  1. Follow up with PCP in 1-2 weeks 2. Follow-up with oncology in 1 week 3. Please obtain BMP/CBC in one week 4. Please follow up on the following pending results: Pleural biopsy pathology results.  Ca 19 9  Home Health: No Equipment/Devices: Rolling walker Discharge Condition: Fair CODE STATUS: Full Diet recommendation: Heart Healthy   Brief/Interim Summary: Donald R Morrisis a 85 y.o.malewith medical history significant ofhypertension, hyperlipidemia, COPD, hypothyroidism,sCHF with EF of 40%, who presents with shortness of breath.  Patient has been havingshortness of breath foralmost 3 weeks. He also reports right-sided chest pain, right shoulder pain, and mild cough. He has 20 pounds of weight loss recently.  Found to have leukocytosis and CT chest with large pleural-based mass with chest wall invasion, bilateral adrenal lesions suspicious for metastatic disease and a small mass in the head of pancreas.  Oncology was consulted and he underwent pleural biopsy by IR.  Patient tolerated the procedure well and is being discharged home to follow-up with oncology. He was also given Norco to help with pain.  Patient also found to have leukocytosis, procalcitonin was elevated, most likely has underlying malignancy.  He was treated for CAP/obstructive pneumonia with ceftriaxone and azithromycin in the hospital and discharge on Levaquin.  He will continue with rest of his home meds and follow-up with his primary care physician.  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 I am giving you antibiotics for 1 week, please take it as directed. We are also giving you some pain medications, please be careful as it can make you drowsy and increase the risk of fall. Pain medication can also cause constipation, you can use MiraLAX or any other stool softeners on a daily basis. Please follow-up with your oncologist.   Increase activity slowly   Complete by: As directed      Allergies as of 10/06/2019   No Known Allergies     Medication List    STOP taking these medications   hydrochlorothiazide 12.5 MG capsule Commonly known as: MICROZIDE     TAKE these medications   amLODipine 10 MG tablet Commonly known as: NORVASC Take 10 mg by mouth daily.   aspirin 81 MG EC tablet Take by mouth.   dextromethorphan-guaiFENesin 30-600 MG 12hr tablet Commonly known as: MUCINEX DM Take 1 tablet by mouth 2 (two) times daily as needed for cough.   feeding supplement (ENSURE ENLIVE) Liqd Take 237 mLs by mouth 2 (two) times daily between meals. Start taking on: Oct 07, 2019   HYDROcodone-acetaminophen 5-325 MG tablet Commonly known as: NORCO/VICODIN Take 1 tablet by mouth every 6 (six) hours as needed for moderate pain or severe pain.   levofloxacin  750 MG tablet Commonly known as: Levaquin Take 1 tablet (750 mg total) by mouth daily for 7 days.   levothyroxine 50 MCG tablet Commonly known as: SYNTHROID Take 50 mcg by mouth daily.   lisinopril 10 MG tablet Commonly known as: ZESTRIL Take 10 mg by mouth daily.   rosuvastatin 10 MG tablet Commonly  known as: CRESTOR Take 10 mg by mouth at bedtime.       No Known Allergies  Consultations:  Oncology  Procedures/Studies: DG Chest 2 View  Result Date: 10/04/2019 CLINICAL DATA:  Shortness of breath EXAM: CHEST - 2 VIEW COMPARISON:  06/12/2006 FINDINGS: Cardiac shadow is within normal limits. Left lung is hyperinflated but clear. There is a pleural based mass lesion identified along the lateral chest wall on the right which measures 9.5 cm in greatest dimension. There are destructive changes involving the second, third and fourth ribs consistent with local invasion. Associated parenchymal density is noted likely related to lymphangitic spread in the upper lobe. No sizable effusion is seen. IMPRESSION: Pleural based mass lesion consistent with pulmonary neoplasm with erosive changes involving the second, third and fourth ribs as well as likely lymphangitic spread within the right upper lobe. CT of the chest is recommended for further evaluation. Electronically Signed   By: Inez Catalina M.D.   On: 10/04/2019 11:45   CT Angio Chest PE W and/or Wo Contrast  Result Date: 10/04/2019 CLINICAL DATA:  Pleural mass with bony destruction on recent chest x-ray, shortness of breath EXAM: CT ANGIOGRAPHY CHEST WITH CONTRAST TECHNIQUE: Multidetector CT imaging of the chest was performed using the standard protocol during bolus administration of intravenous contrast. Multiplanar CT image reconstructions and MIPs were obtained to evaluate the vascular anatomy. CONTRAST:  72mL OMNIPAQUE IOHEXOL 350 MG/ML SOLN COMPARISON:  Chest x-ray from earlier in the same day FINDINGS: Cardiovascular: Thoracic aorta and its branches demonstrate atherosclerotic calcifications. No aneurysm or dissection is seen. No cardiac enlargement is noted. Coronary calcifications are noted. The pulmonary artery shows a normal branching pattern without intraluminal filling defect to suggest pulmonary embolism. Mediastinum/Nodes: Thoracic inlet  is within normal limits. No sizable hilar or mediastinal adenopathy is noted. The esophagus as visualized is within normal limits. Lungs/Pleura: Left lung demonstrates emphysematous change and apical blebs. No focal infiltrate or effusion is seen. Small 3 mm nodule is noted in the lateral aspect of the left lower lobe best seen on image number 104 of series 6. No other definitive nodules are seen. In the right upper lobe, there is a pleural base mass lesion with chest wall invasion which measures at least 12.8 x 9.0 cm. There are erosive changes of the second third and fourth ribs related to the underlying mass lesion. Additionally there are invasion into the subscapularis rib region. Diffuse lymphangitic spread of neoplasm is noted throughout the right upper lobe similar to that seen on prior plain film examination. Calcified granuloma is noted in the right middle lobe. Large right-sided pleural effusion is seen. Upper Abdomen: Large cystic lesion is noted in the left kidney measuring 5 cm most consistent with a simple cyst. Smaller cysts are noted within the upper pole of the left kidney. Nonobstructing 4 mm stone is noted in the upper pole of the right kidney. Spleen is within normal limits. The liver and gallbladder are unremarkable. Focal soft tissue mass lesion is noted which appears to arise from the head of the pancreas. It envelops the right hepatic artery and measures approximately 6.9 x 5.1 cm. Bilateral adrenal  lesions are noted suspicious for metastatic disease. Largest of these lies on the right measuring 3.1 cm. Musculoskeletal: Resorption of the second third and fourth ribs on the right is seen related to the underlying pleural based mass lesion. An additional pleural based lesion involving the right chest wall is noted anteriorly best seen on image number 231 of series 5. This measures approximately 2.8 cm and causes some destruction of the anterior aspect of the right fifth rib. No other bony  destructive lesions are seen. Review of the MIP images confirms the above findings. IMPRESSION: Large pleural base mass with right chest wall invasion and resorption of the second third fourth and fifth ribs in varying amounts. Associated right-sided pleural effusion is noted as well as significant lymphangitic spread of neoplasm in the right upper lobe. PET-CT in further workup is recommended as clinically indicated. Bilateral adrenal lesions are noted suspicious for metastatic disease. There is a soft tissue mass lesion which appears to arise from the head of the pancreas superiorly. This may represent metastatic disease although the possibility of a pancreatic primary deserves consideration. No evidence of pulmonary emboli. Aortic Atherosclerosis (ICD10-I70.0) and Emphysema (ICD10-J43.9). Electronically Signed   By: Inez Catalina M.D.   On: 10/04/2019 13:01     Subjective: Patient has no new complaints today.  He was waiting for biopsy.  He was eager to go home afterwards.  Discharge Exam: Vitals:   10/06/19 1105 10/06/19 1113  BP: (!) 202/133 (!) 110/27  Pulse: (!) 108 94  Resp: (!) 34 18  Temp:    SpO2: 100% 99%   Vitals:   10/06/19 1049 10/06/19 1059 10/06/19 1105 10/06/19 1113  BP: (!) 110/30 (!) 117/39 (!) 202/133 (!) 110/27  Pulse: 97 (!) 110 (!) 108 94  Resp: (!) 53 (!) 37 (!) 34 18  Temp:      TempSrc:      SpO2: 93% 98% 100% 99%  Weight:      Height:        General: Pt is alert, awake, not in acute distress Cardiovascular: RRR, S1/S2 +, no rubs, no gallops Respiratory: CTA bilaterally, no wheezing, no rhonchi Abdominal: Soft, NT, ND, bowel sounds + Extremities: no edema, no cyanosis   The results of significant diagnostics from this hospitalization (including imaging, microbiology, ancillary and laboratory) are listed below for reference.    Microbiology: Recent Results (from the past 240 hour(s))  SARS Coronavirus 2 by RT PCR (hospital order, performed in [facility] hospital lab) Nasopharyngeal Nasopharyngeal Swab     Status: None   Collection Time: 10/04/19  2:10 PM   Specimen: Nasopharyngeal Swab  Result Value Ref Range Status   SARS Coronavirus 2 NEGATIVE NEGATIVE Final    Comment: (NOTE) SARS-CoV-2 target nucleic acids are NOT DETECTED. The SARS-CoV-2 RNA is generally detectable in upper and lower respiratory specimens during the acute phase of infection. The lowest concentration of SARS-CoV-2 viral copies this assay can detect is 250 copies / mL. A negative result does not preclude SARS-CoV-2 infection and should not be used as the sole basis for treatment or other patient management decisions.  A negative result may occur with improper specimen collection / handling, submission of specimen other than nasopharyngeal swab, presence of viral mutation(s) within the areas targeted by this assay, and inadequate number of viral copies (<250 copies / mL). A negative result must be combined with clinical observations, patient history, and epidemiological information. Fact Sheet for Patients:   StrictlyIdeas.no Fact Sheet for Healthcare  Providers: BankingDealers.co.za This test is not yet approved or cleared  by the [place] and has been authorized for detection and/or diagnosis of SARS-CoV-2 by FDA under an Emergency Use Authorization (EUA).  This EUA will remain in effect (meaning this test can be used) for the duration of the COVID-19 declaration under Section 564(b)(1) of the Act, 21 U.S.C. section 360bbb-3(b)(1), unless the authorization is terminated or revoked sooner. Performed at [facility], [address] [place], [place] [address]   CULTURE, BLOOD (ROUTINE X 2) w Reflex to ID Panel     Status: None (Preliminary result)   Collection Time: 10/04/19  6:39 PM   Specimen: BLOOD  Result Value Ref Range Status   Specimen Description BLOOD RAC  Final   Special Requests    Final    BOTTLES DRAWN AEROBIC AND ANAEROBIC Blood Culture adequate volume   Culture  Setup Time ANAEROBIC BOTTLE ONLY  Final   Culture   Final    NO GROWTH 2 DAYS Performed at [facility], [address] [place], [place] [address]    Report Status PENDING  Incomplete  CULTURE, BLOOD (ROUTINE X 2) w Reflex to ID Panel     Status: None (Preliminary result)   Collection Time: 10/04/19  7:16 PM   Specimen: BLOOD  Result Value Ref Range Status   Specimen Description BLOOD RIGHT ANTECUBITAL  Final   Special Requests   Final    BOTTLES DRAWN AEROBIC AND ANAEROBIC Blood Culture results may not be optimal due to an inadequate volume of blood received in culture bottles   Culture   Final    NO GROWTH 2 DAYS Performed at [facility], [address] [place], [place] [address]    Report Status PENDING  Incomplete     Labs: BNP (last 3 results) Recent Labs    10/04/19 1130  BNP 588.5*   Basic Metabolic Panel: Recent Labs  Lab 10/04/19 1030 10/05/19 0446  NA 136 140  K 4.3 4.3  CL 103 108  CO2 23 23  GLUCOSE 120* 83  BUN 25* 24*  CREATININE 0.83 0.90  CALCIUM 8.6* 8.7*  MG  --  1.8   Liver Function Tests: Recent Labs  Lab 10/04/19 1030  AST 19  ALT 12  ALKPHOS 69  BILITOT 0.6  PROT 6.5  ALBUMIN 2.6*   No results for input(s): LIPASE, AMYLASE in the last 168 hours. No results for input(s): AMMONIA in the last 168 hours. CBC: Recent Labs  Lab 10/04/19 1030 10/05/19 0446 10/06/19 0515  WBC 31.8* 31.2* 33.6*  NEUTROABS 29.2*  --   --   HGB 10.8* 10.0* 9.8*  HCT 34.4* 31.0* 29.7*  MCV 91.5 90.4 88.1  PLT 401* 397 377   Cardiac Enzymes: No results for input(s): CKTOTAL, CKMB, CKMBINDEX, TROPONINI in the last 168 hours. BNP: Invalid input(s): POCBNP CBG: No results for input(s): GLUCAP in the last 168 hours. D-Dimer No results for input(s): DDIMER in the last 72 hours. Hgb A1c No results for input(s): HGBA1C in the last 72  hours. Lipid Profile No results for input(s): CHOL, HDL, LDLCALC, TRIG, CHOLHDL, LDLDIRECT in the last 72 hours. Thyroid function studies No results for input(s): TSH, T4TOTAL, T3FREE, THYROIDAB in the last 72 hours.  Invalid input(s): FREET3 Anemia work up No results for input(s): VITAMINB12, FOLATE, FERRITIN, TIBC, IRON, RETICCTPCT in the last 72 hours. Urinalysis No results found for: COLORURINE, APPEARANCEUR, LABSPEC, [place], GLUCOSEU, HGBUR, BILIRUBINUR, KETONESUR, PROTEINUR, UROBILINOGEN, NITRITE,  LEUKOCYTESUR Sepsis Labs Invalid input(s): PROCALCITONIN,  WBC,  LACTICIDVEN Microbiology Recent Results (from the past 240 hour(s))  SARS Coronavirus 2 by RT PCR (hospital order, performed in [facility] hospital lab) Nasopharyngeal Nasopharyngeal Swab     Status: None   Collection Time: 10/04/19  2:10 PM   Specimen: Nasopharyngeal Swab  Result Value Ref Range Status   SARS Coronavirus 2 NEGATIVE NEGATIVE Final    Comment: (NOTE) SARS-CoV-2 target nucleic acids are NOT DETECTED. The SARS-CoV-2 RNA is generally detectable in upper and lower respiratory specimens during the acute phase of infection. The lowest concentration of SARS-CoV-2 viral copies this assay can detect is 250 copies / mL. A negative result does not preclude SARS-CoV-2 infection and should not be used as the sole basis for treatment or other patient management decisions.  A negative result may occur with improper specimen collection / handling, submission of specimen other than nasopharyngeal swab, presence of viral mutation(s) within the areas targeted by this assay, and inadequate number of viral copies (<250 copies / mL). A negative result must be combined with clinical observations, patient history, and epidemiological information. Fact Sheet for Patients:   StrictlyIdeas.no Fact Sheet for Healthcare Providers: BankingDealers.co.za This test is not yet approved  or cleared  by the [place] FDA and has been authorized for detection and/or diagnosis of SARS-CoV-2 by FDA under an Emergency Use Authorization (EUA).  This EUA will remain in effect (meaning this test can be used) for the duration of the COVID-19 declaration under Section 564(b)(1) of the Act, 21 U.S.C. section 360bbb-3(b)(1), unless the authorization is terminated or revoked sooner. Performed at [facility], [address] [place], [place] [address]   CULTURE, BLOOD (ROUTINE X 2) w Reflex to ID Panel     Status: None (Preliminary result)   Collection Time: 10/04/19  6:39 PM   Specimen: BLOOD  Result Value Ref Range Status   Specimen Description BLOOD RAC  Final   Special Requests   Final    BOTTLES DRAWN AEROBIC AND ANAEROBIC Blood Culture adequate volume   Culture  Setup Time ANAEROBIC BOTTLE ONLY  Final   Culture   Final    NO GROWTH 2 DAYS Performed at [facility], [address] [place], [place] [address]    Report Status PENDING  Incomplete  CULTURE, BLOOD (ROUTINE X 2) w Reflex to ID Panel     Status: None (Preliminary result)   Collection Time: 10/04/19  7:16 PM   Specimen: BLOOD  Result Value Ref Range Status   Specimen Description BLOOD RIGHT ANTECUBITAL  Final   Special Requests   Final    BOTTLES DRAWN AEROBIC AND ANAEROBIC Blood Culture results may not be optimal due to an inadequate volume of blood received in culture bottles   Culture   Final    NO GROWTH 2 DAYS Performed at [facility], [address], [place], [place] [address]    Report Status PENDING  Incomplete    Time coordinating discharge: Over 30 minutes  SIGNED:  Lorella Nimrod, MD  [facility] 10/06/2019, 12:41 PM  If 7PM-7AM, please contact night-coverage [email or web address]  This record has been created using Systems analyst. Errors have been sought and corrected,but may not always be located. Such creation errors do not reflect on  the standard of care.

## 2019-10-07 NOTE — Addendum Note (Signed)
Addended by: Gloris Ham on: 10/07/2019 08:28 AM   Modules accepted: Orders

## 2019-10-08 ENCOUNTER — Other Ambulatory Visit: Payer: Self-pay | Admitting: Pathology

## 2019-10-08 LAB — SURGICAL PATHOLOGY

## 2019-10-09 LAB — CULTURE, BLOOD (ROUTINE X 2): Culture: NO GROWTH

## 2019-10-10 LAB — CULTURE, BLOOD (ROUTINE X 2)
Culture  Setup Time: NONE SEEN
Culture: NO GROWTH
Special Requests: ADEQUATE

## 2019-10-14 ENCOUNTER — Encounter: Payer: Self-pay | Admitting: *Deleted

## 2019-10-14 ENCOUNTER — Other Ambulatory Visit: Payer: Self-pay

## 2019-10-14 ENCOUNTER — Inpatient Hospital Stay: Payer: Medicare HMO

## 2019-10-14 ENCOUNTER — Inpatient Hospital Stay: Payer: Medicare HMO | Attending: Internal Medicine | Admitting: Internal Medicine

## 2019-10-14 ENCOUNTER — Ambulatory Visit
Admission: RE | Admit: 2019-10-14 | Discharge: 2019-10-14 | Disposition: A | Discharge status: Home / Self Care | Payer: Medicare HMO | Source: Ambulatory Visit | Attending: Radiation Oncology | Admitting: Radiation Oncology

## 2019-10-14 VITALS — BP 122/55 | HR 77 | Temp 98.4°F | Wt 120.0 lb

## 2019-10-14 VITALS — BP 128/68 | HR 93 | Temp 96.1°F | Resp 16

## 2019-10-14 DIAGNOSIS — C3411 Malignant neoplasm of upper lobe, right bronchus or lung: Secondary | ICD-10-CM | POA: Diagnosis not present

## 2019-10-14 DIAGNOSIS — Z7982 Long term (current) use of aspirin: Secondary | ICD-10-CM | POA: Insufficient documentation

## 2019-10-14 DIAGNOSIS — Z7952 Long term (current) use of systemic steroids: Secondary | ICD-10-CM | POA: Diagnosis not present

## 2019-10-14 DIAGNOSIS — Z8249 Family history of ischemic heart disease and other diseases of the circulatory system: Secondary | ICD-10-CM | POA: Diagnosis not present

## 2019-10-14 DIAGNOSIS — Z79899 Other long term (current) drug therapy: Secondary | ICD-10-CM | POA: Insufficient documentation

## 2019-10-14 DIAGNOSIS — J449 Chronic obstructive pulmonary disease, unspecified: Secondary | ICD-10-CM | POA: Diagnosis not present

## 2019-10-14 DIAGNOSIS — I255 Ischemic cardiomyopathy: Secondary | ICD-10-CM | POA: Insufficient documentation

## 2019-10-14 DIAGNOSIS — C7972 Secondary malignant neoplasm of left adrenal gland: Secondary | ICD-10-CM | POA: Insufficient documentation

## 2019-10-14 DIAGNOSIS — I10 Essential (primary) hypertension: Secondary | ICD-10-CM | POA: Diagnosis not present

## 2019-10-14 DIAGNOSIS — Z87891 Personal history of nicotine dependence: Secondary | ICD-10-CM | POA: Diagnosis not present

## 2019-10-14 DIAGNOSIS — C7951 Secondary malignant neoplasm of bone: Secondary | ICD-10-CM | POA: Diagnosis not present

## 2019-10-14 DIAGNOSIS — C7971 Secondary malignant neoplasm of right adrenal gland: Secondary | ICD-10-CM | POA: Diagnosis not present

## 2019-10-14 DIAGNOSIS — R222 Localized swelling, mass and lump, trunk: Secondary | ICD-10-CM | POA: Diagnosis not present

## 2019-10-14 DIAGNOSIS — R0789 Other chest pain: Secondary | ICD-10-CM | POA: Insufficient documentation

## 2019-10-14 DIAGNOSIS — G893 Neoplasm related pain (acute) (chronic): Secondary | ICD-10-CM | POA: Insufficient documentation

## 2019-10-14 MED ORDER — HYDROCODONE-ACETAMINOPHEN 10-325 MG PO TABS: 1.0000 | ORAL_TABLET | Freq: Four times a day (QID) | ORAL | 0 refills | Status: DC | PRN

## 2019-10-14 MED ORDER — DEXAMETHASONE 4 MG PO TABS: 4.0000 mg | ORAL_TABLET | Freq: Two times a day (BID) | ORAL | 3 refills | Status: AC

## 2019-10-14 NOTE — Consult Note (Signed)
NEW PATIENT EVALUATION  Name: Donald Gibbs  MRN: [id number]  Date:   10/14/2019     DOB: 30-Mar-1935   This 85 y.o. male patient presents to the clinic for initial evaluation of locally advanced poorly differentiated lung cancer for palliative radiation.  REFERRING PHYSICIAN: Cammie Sickle, *  CHIEF COMPLAINT:  Chief Complaint  Patient presents with  . Lung Cancer    Ref Dr. B     DIAGNOSIS: There were no encounter diagnoses.   PREVIOUS INVESTIGATIONS:  CT scans reviewed PET CT scan ordered clinical notes reviewed pathology report reviewed  HPI: Patient is an 85 year old male seen and admitted to the hospital for right chest pain.  Initial CT scan demonstrated an extremely large right-sided pleural-based mass with involvement of the second third fourth and fifth ribs.  There is also an associated right pleural effusion.  PET scan has been ordered.  Patient also has known bilateral adrenal lesions suspicious for malignancy as well as a soft tissue mass lesion arising from the head of the pancreas.  Ultrasound-guided core biopsy of his chest wall mass was positive for poorly differentiated carcinoma with squamous differentiation and focal mucin production.  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  I been asked to see the patient to assess for palliative radiation therapy.  He is in quite a bit of pain in both his back and right chest.  He states he is having no significant hemoptysis cough.  PET scan is being ordered.  PLANNED TREATMENT REGIMEN: Palliative radiation therapy to chest  PAST MEDICAL HISTORY:  has a past medical history of Cardiomyopathy, COPD (chronic obstructive pulmonary disease) ([place]), Decreased left ventricular function, and HTN (hypertension).    PAST SURGICAL HISTORY: No past surgical history on  file.  FAMILY HISTORY: family history includes Heart attack in his brother.  SOCIAL HISTORY:  reports that he has quit smoking. He has never used smokeless tobacco. He reports previous alcohol use. He reports previous drug use.  ALLERGIES: Patient has no known allergies.  MEDICATIONS:  Current Outpatient Medications  Medication Sig Dispense Refill  . amLODipine (NORVASC) 10 MG tablet Take 10 mg by mouth daily.    Marland Kitchen aspirin 81 MG EC tablet Take by mouth.    . dextromethorphan-guaiFENesin (MUCINEX DM) 30-600 MG 12hr tablet Take 1 tablet by mouth 2 (two) times daily as needed for cough. 30 tablet 0  . feeding supplement, ENSURE ENLIVE, (ENSURE ENLIVE) LIQD Take 237 mLs by mouth 2 (two) times daily between meals. 237 mL 12  . HYDROcodone-acetaminophen (NORCO/VICODIN) 5-325 MG tablet Take 1 tablet by mouth every 6 (six) hours as needed for moderate pain or severe pain. 4 tablet 0  . levothyroxine (SYNTHROID) 50 MCG tablet Take 50 mcg by mouth daily.    Marland Kitchen lisinopril (ZESTRIL) 10 MG tablet Take 10 mg by mouth daily.    . rosuvastatin (CRESTOR) 10 MG tablet Take 10 mg by mouth at bedtime.     No current facility-administered medications for this encounter.    ECOG PERFORMANCE STATUS:  2 - Symptomatic, <50% confined to bed  REVIEW OF SYSTEMS: Patient denies any weight loss, fatigue, weakness, fever, chills or night sweats. Patient denies any loss of vision, blurred vision. Patient denies any ringing  of the ears or hearing loss. No irregular heartbeat. Patient denies heart murmur or history of fainting. Patient denies any chest pain or  pain radiating to her upper extremities. Patient denies any shortness of breath, difficulty breathing at night, cough or hemoptysis. Patient denies any swelling in the lower legs. Patient denies any nausea vomiting, vomiting of blood, or coffee ground material in the vomitus. Patient denies any stomach pain. Patient states has had normal bowel movements no significant  constipation or diarrhea. Patient denies any dysuria, hematuria or significant nocturia. Patient denies any problems walking, swelling in the joints or loss of balance. Patient denies any skin changes, loss of hair or loss of weight. Patient denies any excessive worrying or anxiety or significant depression. Patient denies any problems with insomnia. Patient denies excessive thirst, polyuria, polydipsia. Patient denies any swollen glands, patient denies easy bruising or easy bleeding. Patient denies any recent infections, allergies or URI. Patient "s visual fields have not changed significantly in recent time.   PHYSICAL EXAM: BP 128/68   Pulse 93   Temp (!) 96.1 F (35.6 C)   Resp 16  Thin cachectic male in NAD decreased breath breath sounds on the right side are noted.  Well-developed well-nourished patient in NAD. HEENT reveals PERLA, EOMI, discs not visualized.  Oral cavity is clear. No oral mucosal lesions are identified. Neck is clear without evidence of cervical or supraclavicular adenopathy. Lungs are clear to A&P. Cardiac examination is essentially unremarkable with regular rate and rhythm without murmur rub or thrill. Abdomen is benign with no organomegaly or masses noted. Motor sensory and DTR levels are equal and symmetric in the upper and lower extremities. Cranial nerves II through XII are grossly intact. Proprioception is intact. No peripheral adenopathy or edema is identified. No motor or sensory levels are noted. Crude visual fields are within normal range.  LABORATORY DATA: Pathology report reviewed    RADIOLOGY RESULTS: CT scan of the chest reviewed   IMPRESSION: Stage IV poorly differentiated lung cancer in 85 year old male for palliative radiation therapy to his lung cancer which is invading his chest wall  PLAN: This time I feel ahead with a course of 3000 cGy in 10 fractions to his right lung mass to try to alleviate some of his chest wall pain caused by its involvement of  the chest wall and ribs.  Risks and benefits of treatment occluding skin reaction fatigue alteration of blood counts possible development of cough and chance of radiation esophagitis all were discussed in detail with the patient and his family.  I have personally set up and ordered CT simulation for tomorrow.  Patient will be seeing medical oncology today for further treatment recommendations.  Family comprehends my treatment plan well.  I would like to take this opportunity to thank you for allowing me to participate in the care of your patient.Noreene Filbert, MD

## 2019-10-14 NOTE — Progress Notes (Signed)
  Oncology Nurse Navigator Documentation  Navigator Location: [facility] (10/14/19 1600)   )Navigator Encounter Type: Initial MedOnc;Initial RadOnc (10/14/19 1600)   Abnormal Finding Date: 10/04/19 (10/14/19 1600) Confirmed Diagnosis Date: 10/08/19 (10/14/19 1600)                 Treatment Phase: Pre-Tx/Tx Discussion (10/14/19 1600) Barriers/Navigation Needs: Coordination of Care;Pain (10/14/19 1600)   Interventions: Coordination of Care;Education;Referrals (10/14/19 1600) Referrals: Palliative Care (10/14/19 1600) Coordination of Care: Appts;Radiology (10/14/19 1600) Education Method: Written (10/14/19 1600)      Acuity: Level 2-Minimal Needs (1-2 Barriers Identified) (10/14/19 1600)    met with patient during initial visit with Dr. Rogue Bussing and Dr. Baruch Gouty to further discuss treatment options and review biopsy results. All questions answered during visit. Pt's family given resources regarding diagnosis and supportive services available. Reviewed upcoming appts. Contact info given and instructed to call with any further questions or needs. Pt and his family verbalized understanding.      Time Spent with Patient: 90 (10/14/19 1600)

## 2019-10-14 NOTE — Assessment & Plan Note (Deleted)
#   DISPOSITION: # PET ASAP # [name] ASAP # follow up MD; labs- cbc.cmp/ldh/ca-1919/cea- 1-2 days days post PET scan-Dr.B

## 2019-10-14 NOTE — Progress Notes (Signed)
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  male patient with remote history of smoking [quit more than 20 years ago] and ischemic cardiomyopathy is was recently admitted to hospital for right posterior chest wall pain/worsening shortness of breath cough.  CT scan emergency room showed-pleural-based mass; mild pleural effusion; adrenal lesions concerning for metastases.  Patient also elevated white count of 30,000 hemoglobin of 10; normal platelets.  Patient underwent CT-guided biopsy of the chest wall mass.  He is here to discuss further treatment options.  Patient continues to have left chest wall pain.  Poor appetite.  Weight loss.  No headaches no nausea no vomiting.  Generalized weakness.  Patient was evaluated by radiation oncology earlier in the day.  He is awaiting to start radiation next week.  Review of Systems  Constitutional: Positive for malaise/fatigue and weight loss. Negative for chills, diaphoresis and fever.  HENT: Negative for nosebleeds and sore throat.   Eyes: Negative for double vision.  Respiratory: Positive for sputum production and shortness of breath. Negative for cough, hemoptysis and wheezing.   Cardiovascular: Negative for chest  pain, palpitations, orthopnea and leg swelling.  Gastrointestinal: Positive for constipation. Negative for abdominal pain, blood in stool, diarrhea, heartburn, melena, nausea and vomiting.  Genitourinary: Negative for dysuria, frequency and urgency.  Musculoskeletal: Positive for back pain, joint pain and myalgias.  Skin: Negative.  Negative for itching and rash.  Neurological: Positive for weakness. Negative for dizziness, tingling, focal weakness and headaches.  Endo/Heme/Allergies: Does not bruise/bleed easily.  Psychiatric/Behavioral: Negative for depression. The patient is not nervous/anxious and does not have insomnia.      MEDICAL HISTORY:  Past Medical History:  Diagnosis Date  . Cardiomyopathy    secondary. echo-EF 25-35%. Adensoine myoview- EF 31% w/a moderately dilated ventricle. there was thinning of the inferior wall, tissue attenuation v infarct, no ischemia.   Marland Kitchen COPD (chronic obstructive pulmonary disease) ([place])   . Decreased left ventricular function   . HTN (hypertension)     SURGICAL HISTORY: No past surgical history on file.  SOCIAL HISTORY: Social History   Socioeconomic History  . Marital status: Married    Spouse name: Not on file  . Number of children: Not on file  . Years of education: Not on file  . Highest education level: Not on file  Occupational History  . Not on file  Tobacco Use  . Smoking status: Former Research scientist (life sciences)  . Smokeless tobacco: Never Used  Substance and Sexual Activity  . Alcohol use: Not Currently  . Drug use: Not Currently  . Sexual activity: Not on file  Other Topics Concern  . Not on file  Social History Narrative   Lives in [place] with wife; quit  smoking > 20 y; no alcohol; son-Ray lives 5-6 miles/in [place]. Worked in Air traffic controller; no asbestos exposure. Walks independently.    Social Determinants of Health   Financial Resource Strain:   . Difficulty of Paying Living Expenses:   Food Insecurity:   . Worried About  Charity fundraiser in the Last Year:   . Arboriculturist in the Last Year:   Transportation Needs:   . Film/video editor (Medical):   Marland Kitchen Lack of Transportation (Non-Medical):   Physical Activity:   . Days of Exercise per Week:   . Minutes of Exercise per Session:   Stress:   . Feeling of Stress :   Social Connections:   . Frequency of Communication with Friends and Family:   . Frequency of Social Gatherings with Friends and Family:   . Attends Religious Services:   . Active Member of Clubs or Organizations:   . Attends Archivist Meetings:   Marland Kitchen Marital Status:   Intimate Partner Violence:   . Fear of Current or Ex-Partner:   . Emotionally Abused:   Marland Kitchen Physically Abused:   . Sexually Abused:     FAMILY HISTORY: Family History  Problem Relation Age of Onset  . Heart attack Brother     ALLERGIES:  has No Known Allergies.  MEDICATIONS:  Current Outpatient Medications  Medication Sig Dispense Refill  . amLODipine (NORVASC) 10 MG tablet Take 10 mg by mouth daily.    Marland Kitchen aspirin 81 MG EC tablet Take by mouth.    . dextromethorphan-guaiFENesin (MUCINEX DM) 30-600 MG 12hr tablet Take 1 tablet by mouth 2 (two) times daily as needed for cough. 30 tablet 0  . feeding supplement, ENSURE ENLIVE, (ENSURE ENLIVE) LIQD Take 237 mLs by mouth 2 (two) times daily between meals. 237 mL 12  . levothyroxine (SYNTHROID) 50 MCG tablet Take 50 mcg by mouth daily.    Marland Kitchen lisinopril (ZESTRIL) 10 MG tablet Take 10 mg by mouth daily.    . rosuvastatin (CRESTOR) 10 MG tablet Take 10 mg by mouth at bedtime.    Marland Kitchen dexamethasone (DECADRON) 4 MG tablet Take 1 tablet (4 mg total) by mouth 2 (two) times daily. 60 tablet 3  . HYDROcodone-acetaminophen (NORCO) 10-325 MG tablet Take 1 tablet by mouth every 6 (six) hours as needed. 65 tablet 0   No current facility-administered medications for this visit.      Marland Kitchen  PHYSICAL EXAMINATION: ECOG PERFORMANCE STATUS: 2 - Symptomatic, <50% confined to  bed  Vitals:   10/14/19 1457  BP: (!) 122/55  Pulse: 77  Temp: 98.4 F (36.9 C)   Filed Weights   10/14/19 1457  Weight: 120 lb (54.4 kg)    Physical Exam  Constitutional: He is oriented to person, place, and time.  [place]; elderly Caucasian male patient.  Accompanied by daughter/son.  Patient is resting/laying on the exam table.  Transfers with wheelchair.  HENT:  Head: Normocephalic and atraumatic.  Mouth/Throat: Oropharynx is clear and moist. No oropharyngeal exudate.  Eyes: Pupils are equal, round, and reactive to light.  Cardiovascular: Normal rate and regular rhythm.  Pulmonary/Chest: Breath sounds normal. No respiratory distress. He has no wheezes.  Abdominal: Soft. Bowel sounds are normal. He exhibits no distension and no mass. There is no abdominal tenderness. There is no rebound and no guarding.  Musculoskeletal:        General: No tenderness or edema. Normal range of motion.     Cervical back: Normal range of motion  and neck supple.  Neurological: He is alert and oriented to person, place, and time.  Skin: Skin is warm.  Psychiatric: Affect normal.     LABORATORY DATA:  I have reviewed the data as listed Lab Results  Component Value Date   WBC 33.6 (H) 10/06/2019   HGB 9.8 (L) 10/06/2019   HCT 29.7 (L) 10/06/2019   MCV 88.1 10/06/2019   PLT 377 10/06/2019   Recent Labs    10/04/19 1030 10/05/19 0446  NA 136 140  K 4.3 4.3  CL 103 108  CO2 23 23  GLUCOSE 120* 83  BUN 25* 24*  CREATININE 0.83 0.90  CALCIUM 8.6* 8.7*  GFRNONAA >60 >60  GFRAA >60 >60  PROT 6.5  --   ALBUMIN 2.6*  --   AST 19  --   ALT 12  --   ALKPHOS 69  --   BILITOT 0.6  --     RADIOGRAPHIC STUDIES: I have personally reviewed the radiological images as listed and agreed with the findings in the report. DG Chest 2 View  Result Date: 10/04/2019 CLINICAL DATA:  Shortness of breath EXAM: CHEST - 2 VIEW COMPARISON:  06/12/2006 FINDINGS: Cardiac shadow is within normal limits.  Left lung is hyperinflated but clear. There is a pleural based mass lesion identified along the lateral chest wall on the right which measures 9.5 cm in greatest dimension. There are destructive changes involving the second, third and fourth ribs consistent with local invasion. Associated parenchymal density is noted likely related to lymphangitic spread in the upper lobe. No sizable effusion is seen. IMPRESSION: Pleural based mass lesion consistent with pulmonary neoplasm with erosive changes involving the second, third and fourth ribs as well as likely lymphangitic spread within the right upper lobe. CT of the chest is recommended for further evaluation. Electronically Signed   By: Inez Catalina M.D.   On: 10/04/2019 11:45   CT Angio Chest PE W and/or Wo Contrast  Result Date: 10/04/2019 CLINICAL DATA:  Pleural mass with bony destruction on recent chest x-ray, shortness of breath EXAM: CT ANGIOGRAPHY CHEST WITH CONTRAST TECHNIQUE: Multidetector CT imaging of the chest was performed using the standard protocol during bolus administration of intravenous contrast. Multiplanar CT image reconstructions and MIPs were obtained to evaluate the vascular anatomy. CONTRAST:  23mL OMNIPAQUE IOHEXOL 350 MG/ML SOLN COMPARISON:  Chest x-ray from earlier in the same day FINDINGS: Cardiovascular: Thoracic aorta and its branches demonstrate atherosclerotic calcifications. No aneurysm or dissection is seen. No cardiac enlargement is noted. Coronary calcifications are noted. The pulmonary artery shows a normal branching pattern without intraluminal filling defect to suggest pulmonary embolism. Mediastinum/Nodes: Thoracic inlet is within normal limits. No sizable hilar or mediastinal adenopathy is noted. The esophagus as visualized is within normal limits. Lungs/Pleura: Left lung demonstrates emphysematous change and apical blebs. No focal infiltrate or effusion is seen. Small 3 mm nodule is noted in the lateral aspect of the left  lower lobe best seen on image number 104 of series 6. No other definitive nodules are seen. In the right upper lobe, there is a pleural base mass lesion with chest wall invasion which measures at least 12.8 x 9.0 cm. There are erosive changes of the second third and fourth ribs related to the underlying mass lesion. Additionally there are invasion into the subscapularis rib region. Diffuse lymphangitic spread of neoplasm is noted throughout the right upper lobe similar to that seen on prior plain film examination. Calcified granuloma is noted in the  right middle lobe. Large right-sided pleural effusion is seen. Upper Abdomen: Large cystic lesion is noted in the left kidney measuring 5 cm most consistent with a simple cyst. Smaller cysts are noted within the upper pole of the left kidney. Nonobstructing 4 mm stone is noted in the upper pole of the right kidney. Spleen is within normal limits. The liver and gallbladder are unremarkable. Focal soft tissue mass lesion is noted which appears to arise from the head of the pancreas. It envelops the right hepatic artery and measures approximately 6.9 x 5.1 cm. Bilateral adrenal lesions are noted suspicious for metastatic disease. Largest of these lies on the right measuring 3.1 cm. Musculoskeletal: Resorption of the second third and fourth ribs on the right is seen related to the underlying pleural based mass lesion. An additional pleural based lesion involving the right chest wall is noted anteriorly best seen on image number 231 of series 5. This measures approximately 2.8 cm and causes some destruction of the anterior aspect of the right fifth rib. No other bony destructive lesions are seen. Review of the MIP images confirms the above findings. IMPRESSION: Large pleural base mass with right chest wall invasion and resorption of the second third fourth and fifth ribs in varying amounts. Associated right-sided pleural effusion is noted as well as significant lymphangitic  spread of neoplasm in the right upper lobe. PET-CT in further workup is recommended as clinically indicated. Bilateral adrenal lesions are noted suspicious for metastatic disease. There is a soft tissue mass lesion which appears to arise from the head of the pancreas superiorly. This may represent metastatic disease although the possibility of a pancreatic primary deserves consideration. No evidence of pulmonary emboli. Aortic Atherosclerosis (ICD10-I70.0) and Emphysema (ICD10-J43.9). Electronically Signed   By: Inez Catalina M.D.   On: 10/04/2019 13:01   DG Chest Port 1 View  Result Date: 10/06/2019 CLINICAL DATA:  Status post chest wall biopsy EXAM: PORTABLE CHEST 1 VIEW COMPARISON:  10/04/2019 FINDINGS: Cardiac shadow is stable. Aortic calcifications are seen. Left lung remains clear. Right lung again demonstrates pleural based mass with changes of lymphangitic spread throughout the upper lobe. Bony destruction in the rib cage is seen. No pneumothorax is seen. No new focal abnormality is noted. IMPRESSION: Stable appearance of the chest when compared with the prior exam. No pneumothorax is identified. Electronically Signed   By: Inez Catalina M.D.   On: 10/06/2019 15:02   [place] CORE BIOPSY (SOFT TISSUE)  Result Date: 10/06/2019 INDICATION: Known right upper lobe mass lesion with chest wall invasion and bony destruction EXAM: ULTRASOUND-GUIDED RIGHT POSTERIOR CHEST WALL BIOPSY MEDICATIONS: None. ANESTHESIA/SEDATION: None FLUOROSCOPY TIME:  Not applicable COMPLICATIONS: None immediate. PROCEDURE: Informed written consent was obtained from the patient after a thorough discussion of the procedural risks, benefits and alternatives. All questions were addressed. Maximal Sterile Barrier Technique was utilized including caps, mask, sterile gowns, sterile gloves, sterile drape, hand hygiene and skin antiseptic. A timeout was performed prior to the initiation of the procedure. Utilizing 1% xylocaine as local anesthetic  and real-time ultrasound guidance a 17 gauge guiding needle was placed percutaneously into posterior chest wall mass invading through the posterior ribcage on the right. Multiple 18 gauge core biopsies were then obtained. The needle was then removed and a pressure dressing placed to aid in hemostasis. Patient tolerated the procedure well and was returned his room in satisfactory condition. IMPRESSION: Successful ultrasound-guided biopsy of a right posterior chest wall mass invading through the ribcage from the upper lobe.  Electronically Signed   By: Inez Catalina M.D.   On: 10/06/2019 13:59    ASSESSMENT & PLAN:   Malignant neoplasm of upper lobe of right lung (HCC) #Right chest wall mass; right upper lobe mass; bilateral adrenal metastases; pancreatic mass.  Biopsy of the chest wall mass shows adenosquamous carcinoma-lung versus pancreatic primary.  Recommend NGS  #Clinically suggestive of lung cancer with metastasis to adrenal/pancreatic mass.   #Right chest wall mass-pain poorly controlled.  Awaiting to start radiation next week.  Recommend starting dexamethasone 4 mg a day.  Also increase Norco 10/325 every 6 hours.  New prescription given.  #Ischemic cardiomyopathy-[30 to 35% ejection fraction]-clinically stable.  #Overall prognosis is poor.  I had a long discussion the patient daughter and son regarding the status of the diagnosis stage IV malignancy incurable.  Discussed that irrespective of the primary of the disease [lung versus pancreatic]-unfortunately median survival is anywhere between 8 months to 14 months.  Recommend palliative care evaluation ASAP.  # DISPOSITION: # PET ASAP # [name] ASAP # follow up MD; labs- cbc.cmp/ldh/ca-1919/cea- 1-2 days days post PET scan-Dr.B  # I reviewed the blood work- with the patient in detail; also reviewed the imaging independently [as summarized above]; and with the patient in detail.    All questions were answered. The patient knows to call the  clinic with any problems, questions or concerns.   Cammie Sickle, MD 10/16/2019 12:42 PM

## 2019-10-15 ENCOUNTER — Inpatient Hospital Stay (HOSPITAL_BASED_OUTPATIENT_CLINIC_OR_DEPARTMENT_OTHER): Payer: Medicare HMO | Admitting: Hospice and Palliative Medicine

## 2019-10-15 ENCOUNTER — Encounter: Payer: Self-pay | Admitting: *Deleted

## 2019-10-15 ENCOUNTER — Ambulatory Visit: Payer: Medicare HMO

## 2019-10-15 DIAGNOSIS — C3491 Malignant neoplasm of unspecified part of right bronchus or lung: Secondary | ICD-10-CM | POA: Insufficient documentation

## 2019-10-15 DIAGNOSIS — Z510 Encounter for antineoplastic radiation therapy: Secondary | ICD-10-CM | POA: Insufficient documentation

## 2019-10-15 DIAGNOSIS — Z515 Encounter for palliative care: Secondary | ICD-10-CM

## 2019-10-15 NOTE — Progress Notes (Signed)
Patient left prior to being seen.  I called and spoke with his wife.  She says that patient is doing better and has had increased oral intake and less pain.  She feels the Norco is controlling the pain as long as he takes it every 6 hours.  She says that she is not too familiar with the plan for cancer treatment as her kids are "keeping her out of it."  We will plan to reschedule visit for when patient returns to the cancer center.

## 2019-10-15 NOTE — Progress Notes (Signed)
  Oncology Nurse Navigator Documentation  Navigator Location: [facility] (10/15/19 1300)   )Navigator Encounter Type: Telephone (10/15/19 1300) Telephone: Appt Confirmation/Clarification;Outgoing Call (10/15/19 1300)                       Barriers/Navigation Needs: Coordination of Care (10/15/19 1300)   Interventions: Coordination of Care (10/15/19 1300)   Coordination of Care: Appts (10/15/19 1300)       phone call made to pt's son to review upcoming appts for PET scan and follow up with Dr. Rogue Bussing. All questions answered during call. Reviewed upcoming appts. Instructed to call with any further questions or needs. Pt's son verbalized understanding. Nothing further needed at this time.           Time Spent with Patient: 30 (10/15/19 1300)

## 2019-10-16 ENCOUNTER — Other Ambulatory Visit: Payer: Medicare HMO

## 2019-10-16 DIAGNOSIS — Z510 Encounter for antineoplastic radiation therapy: Secondary | ICD-10-CM | POA: Diagnosis not present

## 2019-10-16 NOTE — Assessment & Plan Note (Addendum)
#  Right chest wall mass; right upper lobe mass; bilateral adrenal metastases; pancreatic mass.  Biopsy of the chest wall mass shows adenosquamous carcinoma-lung versus pancreatic primary.  Recommend NGS  #Clinically suggestive of lung cancer with metastasis to adrenal/pancreatic mass.   #Right chest wall mass-pain poorly controlled.  Awaiting to start radiation next week.  Recommend starting dexamethasone 4 mg a day.  Also increase Norco 10/325 every 6 hours.  New prescription given.  #Ischemic cardiomyopathy-[30 to 35% ejection fraction]-clinically stable.  #Overall prognosis is poor.  I had a long discussion the patient daughter and son regarding the status of the diagnosis stage IV malignancy incurable.  Discussed that irrespective of the primary of the disease [lung versus pancreatic]-unfortunately median survival is anywhere between 8 months to 14 months.  Recommend palliative care evaluation ASAP.  # DISPOSITION: # PET ASAP # [name] ASAP # follow up MD; labs- cbc.cmp/ldh/ca-1919/cea- 1-2 days days post PET scan-Dr.B  # I reviewed the blood work- with the patient in detail; also reviewed the imaging independently [as summarized above]; and with the patient in detail.

## 2019-10-17 NOTE — Progress Notes (Signed)
Tumor Board Documentation  Donald Gibbs was presented by Dr Rogue Bussing at our Tumor Board on 10/16/2019, which included representatives from medical oncology, radiation oncology, navigation, pathology, radiology, surgical, internal medicine, pharmacy, genetics, pulmonology, palliative care, research.  Donald Gibbs currently presents as a new patient, for [place], for new positive pathology with history of the following treatments: surgical intervention(s), active survellience.  Additionally, we reviewed previous medical and familial history, history of present illness, and recent lab results along with all available histopathologic and imaging studies. The tumor board considered available treatment options and made the following recommendations: Palliative radiation therapy(Right Chest Wall) Possible Immunotherapy    NGS testing  The following procedures/referrals were also placed: No orders of the defined types were placed in this encounter.   Clinical Trial Status: not discussed   Staging used: AJCC Stage Group  AJCC Staging: T: 4 N: 1 M: 1 Group: stage 4 Squamous/ Adenocarcinoma of Lung   National site-specific guidelines NCCN were discussed with respect to the case.  Tumor board is a meeting of clinicians from various specialty areas who evaluate and discuss patients for whom a multidisciplinary approach is being considered. Final determinations in the plan of care are those of the provider(s). The responsibility for follow up of recommendations given during tumor board is that of the provider.   Todays extended care, comprehensive team conference, Donald Gibbs was not present for the discussion and was not examined.   Multidisciplinary Tumor Board is a multidisciplinary case peer review process.  Decisions discussed in the Multidisciplinary Tumor Board reflect the opinions of the specialists present at the conference without having examined the patient.  Ultimately, treatment and diagnostic  decisions rest with the primary provider(s) and the patient.

## 2019-10-20 ENCOUNTER — Encounter: Payer: Self-pay | Admitting: Internal Medicine

## 2019-10-21 ENCOUNTER — Ambulatory Visit: Admission: RE | Admit: 2019-10-21 | Payer: Medicare HMO | Source: Ambulatory Visit

## 2019-10-21 DIAGNOSIS — Z510 Encounter for antineoplastic radiation therapy: Secondary | ICD-10-CM | POA: Diagnosis not present

## 2019-10-22 ENCOUNTER — Ambulatory Visit
Admission: RE | Admit: 2019-10-22 | Discharge: 2019-10-22 | Disposition: A | Discharge status: Home / Self Care | Payer: Medicare HMO | Source: Ambulatory Visit | Attending: Radiation Oncology | Admitting: Radiation Oncology

## 2019-10-22 ENCOUNTER — Encounter: Payer: Self-pay | Admitting: Internal Medicine

## 2019-10-22 ENCOUNTER — Other Ambulatory Visit: Payer: Self-pay

## 2019-10-22 ENCOUNTER — Encounter
Admission: RE | Admit: 2019-10-22 | Discharge: 2019-10-22 | Disposition: A | Discharge status: Home / Self Care | Payer: Medicare HMO | Source: Ambulatory Visit | Attending: Internal Medicine | Admitting: Internal Medicine

## 2019-10-22 ENCOUNTER — Telehealth: Payer: Self-pay | Admitting: *Deleted

## 2019-10-22 DIAGNOSIS — C3411 Malignant neoplasm of upper lobe, right bronchus or lung: Secondary | ICD-10-CM | POA: Diagnosis present

## 2019-10-22 DIAGNOSIS — Z510 Encounter for antineoplastic radiation therapy: Secondary | ICD-10-CM | POA: Diagnosis not present

## 2019-10-22 LAB — GLUCOSE, CAPILLARY: Glucose-Capillary: 90 mg/dL (ref 70–99)

## 2019-10-22 MED ORDER — FLUDEOXYGLUCOSE F - 18 (FDG) INJECTION
6.2000 | Freq: Once | INTRAVENOUS | Status: AC | PRN
  Administered 2019-10-22: 6.48 via INTRAVENOUS

## 2019-10-22 NOTE — Telephone Encounter (Signed)
Patient reported that he has 3 tablets of hydrocodone left in his bottle. Dr. B wrote script on 10/14/19 hydrocodone 10/325- 1 tablet every 6 hours for pain.Marland Kitchen #65 last week. Donald Gibbs requesting script. Per Dr. Rogue Bussing - Donald Gibbs needs to see Merrily Pew, NP for pain management.  Patient informed that Merrily Pew would be willing to do a virtual visit or face to face with NP. orginially scheduled a virtual visit at 11 am tomorrow However, the Donald Gibbs's son is unable to do this time due to his work scheduled.  Discussed Donald Gibbs's care at length with son. Donald Gibbs has noticed Donald Gibbs has had increase levels of agitation. Per Donald Gibbs, Donald Gibbs does not tolerate "long" imaging appointments. Son states that his dad get agitated when having to stay on a 'table' for long periods of time. Discussed pain med schedule with son. Donald Gibbs's son noticed that patient was taking too many tablets. He has discussed these concerns with his dad already. Son states that he personally can't be there all the time due to working. He is trying to juggle is work sch. with all his dad's apts. He can't be there to monitor how many tabs his dad is trying to take, so son put up the rest of the 10/325 bottle (which there were 7 tabs left) as of yesterday. and gave his dad back the 5/325 mg dosing (an old prescription) b/c he was fearful his dad would OD. Son said as of yesterday there were about 3 of the 5/325 dosing left. He will count the meds when he gets to his dad's house shortly and call me back with the exact #.  Son would prefer an apt with [name]/patient after radiation tomorrow afternoon.

## 2019-10-23 ENCOUNTER — Inpatient Hospital Stay (HOSPITAL_BASED_OUTPATIENT_CLINIC_OR_DEPARTMENT_OTHER): Payer: Medicare HMO | Admitting: Hospice and Palliative Medicine

## 2019-10-23 ENCOUNTER — Ambulatory Visit
Admission: RE | Admit: 2019-10-23 | Discharge: 2019-10-23 | Disposition: A | Discharge status: Home / Self Care | Payer: Medicare HMO | Source: Ambulatory Visit | Attending: Radiation Oncology | Admitting: Radiation Oncology

## 2019-10-23 VITALS — BP 159/56 | HR 91 | Temp 95.0°F | Resp 20 | Ht 69.0 in | Wt 114.6 lb

## 2019-10-23 DIAGNOSIS — Z515 Encounter for palliative care: Secondary | ICD-10-CM | POA: Diagnosis not present

## 2019-10-23 DIAGNOSIS — Z510 Encounter for antineoplastic radiation therapy: Secondary | ICD-10-CM | POA: Diagnosis not present

## 2019-10-23 DIAGNOSIS — E039 Hypothyroidism, unspecified: Secondary | ICD-10-CM

## 2019-10-23 DIAGNOSIS — C3411 Malignant neoplasm of upper lobe, right bronchus or lung: Secondary | ICD-10-CM

## 2019-10-23 MED ORDER — MORPHINE SULFATE ER 15 MG PO TBCR: 15.0000 mg | EXTENDED_RELEASE_TABLET | Freq: Two times a day (BID) | ORAL | 0 refills | Status: DC

## 2019-10-23 MED ORDER — ONDANSETRON HCL 4 MG PO TABS: 4.0000 mg | ORAL_TABLET | Freq: Three times a day (TID) | ORAL | 0 refills | Status: AC | PRN

## 2019-10-23 MED ORDER — HYDROCODONE-ACETAMINOPHEN 10-325 MG PO TABS: 1.0000 | ORAL_TABLET | Freq: Four times a day (QID) | ORAL | 0 refills | Status: AC | PRN

## 2019-10-23 NOTE — Progress Notes (Signed)
Patient here for palliative care consult for pain management. Patient's son states that his father has 1 tablet of norco 10/325 mg left in the bottle. Patient admits to doubling up on medications due to pain control. patient denies any pain at the present time. However, patient states that pain is generally located in the right chest and radiates to the right shoulder.  Patient has extensive scratch marks/bloody scabs on his neck and arm. Noted that pt's Nail beds are stained with blood. Pt reports that his skin is dry and itching.  Patient has lost down to 114 lbs from 120 lbs. Pt only able to eat fruit (pears/peaches) and 2 ensures today. Patient. Experience mild nausea this week. Patient reports that his has regular bowel movement with the use of otc stool softeners; previously reports concerns with opiod-induced constipation.  Patient is not taking any norvasc, lisinopril, and crestor, synthroid or mucinex. Has been off the synthroid for at least 2 weeks. I spoke with Billey Chang, NP. He will add a tsh for tomorrow's labs.

## 2019-10-23 NOTE — Progress Notes (Signed)
[facility]  Telephone:([phone] [phone]   Name: Donald Gibbs Date: 10/23/2019 MRN: [id number]  DOB: Jan 24, 1935  Patient Care Team: [place], [place] as PCP - General ([place]) Telford Nab, RN as Oncology Nurse Navigator    REASON FOR CONSULTATION: Donald Gibbs is a 85 y.o. 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  CT revealed a large pleural-based mass with chest wall invasion, bilateral adrenal lesions, and a pancreatic mass suspicious for metastatic disease.  Patient underwent biopsy of his chest wall mass with biopsy positive for adenosquamous carcinoma with lung versus pancreatic primary.  Patient was referred for XRT.  He has had poorly controlled pain and was referred to palliative care to assist with symptom management.  SOCIAL HISTORY:     reports that he has quit smoking. He has never used smokeless tobacco. He reports previous alcohol use. He reports previous drug use.   Patient is married and lives at home with his wife.  He has 3 sons and 2 daughters who live nearby.  Patient has another daughter in [place].  Patient previously worked in [employer] fund[employer].  ADVANCE DIRECTIVES:  Patient son Jeanell Sparrow is reportedly the [facility] POA.  Patient does not have a living will  CODE STATUS:   PAST MEDICAL HISTORY: Past Medical History:  Diagnosis Date  . Cardiomyopathy    secondary. echo-EF 25-35%. Adensoine myoview- EF 31% w/a moderately dilated ventricle. there was thinning of the inferior wall, tissue attenuation v infarct, no ischemia.   Marland Kitchen COPD (chronic obstructive pulmonary disease) ([place])   . Decreased left ventricular function   . HTN (hypertension)     PAST  SURGICAL HISTORY: No past surgical history on file.  HEMATOLOGY/ONCOLOGY HISTORY:  Oncology History Overview Note  # MAY 2021-chest wall biopsy- poorly differentiated squamous  cell / adenosquamous carcinoma of lung as well as metastatic pancreatic  carcinoma with squamous differentiation.  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  MEDICATIONS:  Current Outpatient Medications  Medication Sig Dispense Refill  . amLODipine (NORVASC) 10 MG tablet Take 10 mg by mouth daily.    Marland Kitchen aspirin 81 MG EC tablet Take by mouth.    . dexamethasone (DECADRON) 4 MG tablet Take 1 tablet (4 mg total) by mouth 2 (two) times daily. 60 tablet 3  . dextromethorphan-guaiFENesin (MUCINEX DM) 30-600 MG 12hr tablet Take 1 tablet by mouth 2 (two) times daily as needed for cough. 30 tablet 0  . feeding supplement, ENSURE ENLIVE, (ENSURE ENLIVE) LIQD Take 237 mLs by mouth 2 (two) times daily between meals. 237 mL 12  . HYDROcodone-acetaminophen (NORCO) 10-325 MG tablet Take 1 tablet by mouth every 6 (six) hours as needed. 65 tablet 0  . levothyroxine (SYNTHROID) 50 MCG tablet Take 50 mcg by mouth daily.    Marland Kitchen lisinopril (ZESTRIL) 10 MG tablet Take 10 mg by mouth daily.    Marland Kitchen  rosuvastatin (CRESTOR) 10 MG tablet Take 10 mg by mouth at bedtime.     No current facility-administered medications for this visit.    VITAL SIGNS: There were no vitals taken for this visit. There were no vitals filed for this visit.  Estimated body mass index is 17.72 kg/m as calculated from the following:   Height as of 10/04/19: 5' 9"  (1.753 m).   Weight as of 10/14/19: 120 lb (54.4 kg).  LABS: CBC:    Component Value Date/Time   WBC  33.6 (H) 10/06/2019 0515   HGB 9.8 (L) 10/06/2019 0515   HCT 29.7 (L) 10/06/2019 0515   PLT 377 10/06/2019 0515   MCV 88.1 10/06/2019 0515   NEUTROABS 29.2 (H) 10/04/2019 1030   LYMPHSABS 0.7 10/04/2019 1030   MONOABS 1.3 (H) 10/04/2019 1030   EOSABS 0.1 10/04/2019 1030   BASOSABS 0.1 10/04/2019 1030   Comprehensive Metabolic Panel:    Component Value Date/Time   NA 140 10/05/2019 0446   K 4.3 10/05/2019 0446   CL 108 10/05/2019 0446   CO2 23 10/05/2019 0446   BUN 24 (H) 10/05/2019 0446   CREATININE 0.90 10/05/2019 0446   GLUCOSE 83 10/05/2019 0446   CALCIUM 8.7 (L) 10/05/2019 0446   AST 19 10/04/2019 1030   ALT 12 10/04/2019 1030   ALKPHOS 69 10/04/2019 1030   BILITOT 0.6 10/04/2019 1030   PROT 6.5 10/04/2019 1030   ALBUMIN 2.6 (L) 10/04/2019 1030    RADIOGRAPHIC STUDIES: DG Chest 2 View  Result Date: 10/04/2019 CLINICAL DATA:  Shortness of breath EXAM: CHEST - 2 VIEW COMPARISON:  06/12/2006 FINDINGS: Cardiac shadow is within normal limits. Left lung is hyperinflated but clear. There is a pleural based mass lesion identified along the lateral chest wall on the right which measures 9.5 cm in greatest dimension. There are destructive changes involving the second, third and fourth ribs consistent with local invasion. Associated parenchymal density is noted likely related to lymphangitic spread in the upper lobe. No sizable effusion is seen. IMPRESSION: Pleural based mass lesion consistent with pulmonary neoplasm with erosive changes involving the second, third and fourth ribs as well as likely lymphangitic spread within the right upper lobe. CT of the chest is recommended for further evaluation. Electronically Signed   By: Inez Catalina M.D.   On: 10/04/2019 11:45   CT Angio Chest PE W and/or Wo Contrast  Result Date: 10/04/2019 CLINICAL DATA:  Pleural mass with bony destruction on recent chest x-ray, shortness of breath EXAM: CT ANGIOGRAPHY CHEST WITH CONTRAST TECHNIQUE:  Multidetector CT imaging of the chest was performed using the standard protocol during bolus administration of intravenous contrast. Multiplanar CT image reconstructions and MIPs were obtained to evaluate the vascular anatomy. CONTRAST:  61m OMNIPAQUE IOHEXOL 350 MG/ML SOLN COMPARISON:  Chest x-ray from earlier in the same day FINDINGS: Cardiovascular: Thoracic aorta and its branches demonstrate atherosclerotic calcifications. No aneurysm or dissection is seen. No cardiac enlargement is noted. Coronary calcifications are noted. The pulmonary artery shows a normal branching pattern without intraluminal filling defect to suggest pulmonary embolism. Mediastinum/Nodes: Thoracic inlet is within normal limits. No sizable hilar or mediastinal adenopathy is noted. The esophagus as visualized is within normal limits. Lungs/Pleura: Left lung demonstrates emphysematous change and apical blebs. No focal infiltrate or effusion is seen. Small 3 mm nodule is noted in the lateral aspect of the left lower lobe best seen on image number 104 of series 6. No other definitive nodules are seen. In the right upper  lobe, there is a pleural base mass lesion with chest wall invasion which measures at least 12.8 x 9.0 cm. There are erosive changes of the second third and fourth ribs related to the underlying mass lesion. Additionally there are invasion into the subscapularis rib region. Diffuse lymphangitic spread of neoplasm is noted throughout the right upper lobe similar to that seen on prior plain film examination. Calcified granuloma is noted in the right middle lobe. Large right-sided pleural effusion is seen. Upper Abdomen: Large cystic lesion is noted in the left kidney measuring 5 cm most consistent with a simple cyst. Smaller cysts are noted within the upper pole of the left kidney. Nonobstructing 4 mm stone is noted in the upper pole of the right kidney. Spleen is within normal limits. The liver and gallbladder are unremarkable.  Focal soft tissue mass lesion is noted which appears to arise from the head of the pancreas. It envelops the right hepatic artery and measures approximately 6.9 x 5.1 cm. Bilateral adrenal lesions are noted suspicious for metastatic disease. Largest of these lies on the right measuring 3.1 cm. Musculoskeletal: Resorption of the second third and fourth ribs on the right is seen related to the underlying pleural based mass lesion. An additional pleural based lesion involving the right chest wall is noted anteriorly best seen on image number 231 of series 5. This measures approximately 2.8 cm and causes some destruction of the anterior aspect of the right fifth rib. No other bony destructive lesions are seen. Review of the MIP images confirms the above findings. IMPRESSION: Large pleural base mass with right chest wall invasion and resorption of the second third fourth and fifth ribs in varying amounts. Associated right-sided pleural effusion is noted as well as significant lymphangitic spread of neoplasm in the right upper lobe. PET-CT in further workup is recommended as clinically indicated. Bilateral adrenal lesions are noted suspicious for metastatic disease. There is a soft tissue mass lesion which appears to arise from the head of the pancreas superiorly. This may represent metastatic disease although the possibility of a pancreatic primary deserves consideration. No evidence of pulmonary emboli. Aortic Atherosclerosis (ICD10-I70.0) and Emphysema (ICD10-J43.9). Electronically Signed   By: Inez Catalina M.D.   On: 10/04/2019 13:01   NM PET Image Initial (PI) Skull Base To Thigh  Result Date: 10/22/2019 CLINICAL DATA:  Initial treatment strategy for biopsy proven non-small cell right lung cancer. EXAM: NUCLEAR MEDICINE PET SKULL BASE TO THIGH TECHNIQUE: 6.5 mCi F-18 FDG was injected intravenously. Full-ring PET imaging was performed from the skull base to thigh after the radiotracer. CT data was obtained and used  for attenuation correction and anatomic localization. Fasting blood glucose: 90 mg/dl COMPARISON:  10/04/2019 chest CT angiogram. FINDINGS: Mediastinal blood pool activity: SUV max 2.1 Liver activity: SUV max NA NECK: No hypermetabolic lymph nodes in the neck. Incidental CT findings: Left cerebellar hemisphere encephalomalacia. Mucoperiosteal thickening in the right maxillary sinus. CHEST: Intensely hypermetabolic chest wall invasive peripheral right upper lobe 14.1 x 11.2 cm lung mass with max SUV 22.9 (series 3/image 79) with lytic destruction of large portions of the right second through fifth ribs. No enlarged or hypermetabolic axillary, mediastinal or hilar lymph nodes. Incidental CT findings: Coronary atherosclerosis. Atherosclerotic nonaneurysmal thoracic aorta. Severe centrilobular and paraseptal emphysema. Subcentimeter calcified right middle lobe granuloma. Patchy regions of septal thickening and ground-glass opacity in the right upper lobe surrounding large lung mass. Tiny 3 mm peripheral left lower lobe pulmonary nodule (series 3/image 146), below PET resolution.  ABDOMEN/PELVIS: Hypermetabolic 4.1 cm right adrenal mass with max SUV 8.9 (series 3/image 154). Hypermetabolic 2.2 cm left adrenal mass with max SUV 6.5 (series 3/image 162). Hypermetabolic 2.6 x 2.2 cm left retroperitoneal soft tissue mass lateral to the upper left kidney with max SUV 6.1 (series 3/image 166). Bulky hypermetabolic 5.0 cm short axis diameter porta hepatis lymph node with max SUV 8.5 (series 3/image 155). No abnormal hypermetabolic activity within the liver, pancreas or spleen. No hypermetabolic lymph nodes in the pelvis. Incidental CT findings: Simple 4.9 cm anterior interpolar left renal cyst. Atherosclerotic abdominal aorta with 2.5 cm ectatic infrarenal abdominal aorta. Marked sigmoid diverticulosis. SKELETON: Expansile hypermetabolic lytic anterior right fifth rib metastasis with max SUV 8.4 (series 3/image 123). No  additional hypermetabolic skeletal lesions. Incidental CT findings: none IMPRESSION: 1. Intensely hypermetabolic chest wall invasive peripheral right upper lobe 14.1 cm lung mass with lytic destruction of portions of the right second through fifth ribs, compatible with locally advanced primary bronchogenic carcinoma. 2. Hypermetabolic bilateral adrenal metastases. 3. Hypermetabolic bulky porta hepatis nodal metastasis. 4. Hypermetabolic left retroperitoneal soft tissue metastasis lateral to the upper left kidney. 5. Hypermetabolic expansile lytic anterior right fifth rib metastasis. 6. Tiny 3 mm peripheral left lower lobe pulmonary nodule, below PET resolution. 7. Infrarenal ectatic 2.5 cm abdominal aorta, at risk for aneurysm development. Recommend follow-up aortic ultrasound in 5 years. This recommendation follows ACR consensus guidelines: White Paper of the ACR Incidental Findings Committee II on Vascular Findings. J Am Coll Radiol 2013; 10:789-794. 8. Aortic Atherosclerosis (ICD10-I70.0) and Emphysema (ICD10-J43.9). Additional chronic findings as detailed. Electronically Signed   By: Ilona Sorrel M.D.   On: 10/22/2019 15:35   DG Chest Port 1 View  Result Date: 10/06/2019 CLINICAL DATA:  Status post chest wall biopsy EXAM: PORTABLE CHEST 1 VIEW COMPARISON:  10/04/2019 FINDINGS: Cardiac shadow is stable. Aortic calcifications are seen. Left lung remains clear. Right lung again demonstrates pleural based mass with changes of lymphangitic spread throughout the upper lobe. Bony destruction in the rib cage is seen. No pneumothorax is seen. No new focal abnormality is noted. IMPRESSION: Stable appearance of the chest when compared with the prior exam. No pneumothorax is identified. Electronically Signed   By: Inez Catalina M.D.   On: 10/06/2019 15:02   [place] CORE BIOPSY (SOFT TISSUE)  Result Date: 10/06/2019 INDICATION: Known right upper lobe mass lesion with chest wall invasion and bony destruction EXAM:  ULTRASOUND-GUIDED RIGHT POSTERIOR CHEST WALL BIOPSY MEDICATIONS: None. ANESTHESIA/SEDATION: None FLUOROSCOPY TIME:  Not applicable COMPLICATIONS: None immediate. PROCEDURE: Informed written consent was obtained from the patient after a thorough discussion of the procedural risks, benefits and alternatives. All questions were addressed. Maximal Sterile Barrier Technique was utilized including caps, mask, sterile gowns, sterile gloves, sterile drape, hand hygiene and skin antiseptic. A timeout was performed prior to the initiation of the procedure. Utilizing 1% xylocaine as local anesthetic and real-time ultrasound guidance a 17 gauge guiding needle was placed percutaneously into posterior chest wall mass invading through the posterior ribcage on the right. Multiple 18 gauge core biopsies were then obtained. The needle was then removed and a pressure dressing placed to aid in hemostasis. Patient tolerated the procedure well and was returned his room in satisfactory condition. IMPRESSION: Successful ultrasound-guided biopsy of a right posterior chest wall mass invading through the ribcage from the upper lobe. Electronically Signed   By: Inez Catalina M.D.   On: 10/06/2019 13:59    PERFORMANCE STATUS (ECOG) : 2 - Symptomatic, <50% confined  to bed  Review of Systems Unless otherwise noted, a complete review of systems is negative.  Physical Exam General: NAD, cachectic appearing, frail Cardiovascular: regular rate and rhythm Pulmonary: clear ant fields Abdomen: soft, nontender, + bowel sounds GU: no suprapubic tenderness Extremities: no edema, no joint deformities Skin: Multiple scratches to neck and back Neurological: Weakness but otherwise nonfocal  IMPRESSION: I met with patient and his son, Ray.  I introduced palliative care services and attempted to establish therapeutic rapport.  Patient endorses persistent pain to right chest wall.  He says pain is better today and currently rates as 4 out of  10.  However, yesterday he says pain was 9 out of 10 all day.  Patient has been waking throughout the night to take pain medication.  Son is concerned that patient has been taking the Norco more frequently than has been prescribed.    Hopefully, pain will improve with use of RT.  He is on dexamethasone, which will help with bone pain.  We will plan trial of long-acting morphine to lessen need for frequent use of Norco.  I had a lengthy conversation with son regarding safe use/storage of pain medications.  Son states that he will control patient's pain medications from this point forward.  He is planning to use a pillbox to allow every 12 hours dosing of MS Contin.  Patient denies any adverse effects from pain medication.  He denies constipation.  He reports having a bowel movement daily with use of stool softener.  Oral intake is reportedly minimal.  Patient is drinking Ensure 2 a day but that is about it.  He has had recent weight loss.  Patient has dry skin and has been scratching his neck and back.  He does not appear to have a rash.  He does endorse pruritus.  Patient is on dexamethasone.  I suggested use of cool washcloth and emollient lotion.  Patient has extremely long fingernails and family plans to trim those. Could consider hydroxyzine or gabapentin for pruritus, the latter which might also help with pain if needed.  Patient seems to have a limited understanding regarding the significance of his cancer.  However, his son seems to recognize that this is stage IV disease.  Patient will meet tomorrow with Dr. Rogue Bussing to discuss the results of the PET scan and review options for systemic treatment.  We discussed CODE STATUS.  I sent patient home with ACP documents and a MOST form to review with family.  PLAN: -Continue current scope of treatment -Start MS Contin 15 mg every 12 hours (#30 tablets) -Norco 10-325 mg every 6 hours as needed for breakthrough pain (#60 tablets) -Continue  dexamethasone for appetite, nausea, fatigue, and bone pain -As needed bowel regimen -Increase oral supplements to 3 times daily -Referral to nutrition (discussed with Joli) -ACP/MOST form reviewed -RTC 1 to 2 weeks   Patient expressed understanding and was in agreement with this plan. He also understands that He can call the clinic at any time with any questions, concerns, or complaints.     Time Total: 30 minutes  Visit consisted of counseling and education dealing with the complex and emotionally intense issues of symptom management and palliative care in the setting of serious and potentially life-threatening illness.Greater than 50%  of this time was spent counseling and coordinating care related to the above assessment and plan.  Signed by: Altha Harm, PhD, NP-C

## 2019-10-24 ENCOUNTER — Inpatient Hospital Stay (HOSPITAL_BASED_OUTPATIENT_CLINIC_OR_DEPARTMENT_OTHER): Payer: Medicare HMO | Admitting: Internal Medicine

## 2019-10-24 ENCOUNTER — Ambulatory Visit
Admission: RE | Admit: 2019-10-24 | Discharge: 2019-10-24 | Disposition: A | Discharge status: Home / Self Care | Payer: Medicare HMO | Source: Ambulatory Visit | Attending: Radiation Oncology | Admitting: Radiation Oncology

## 2019-10-24 ENCOUNTER — Encounter: Payer: Self-pay | Admitting: *Deleted

## 2019-10-24 ENCOUNTER — Inpatient Hospital Stay: Payer: Medicare HMO

## 2019-10-24 ENCOUNTER — Inpatient Hospital Stay: Payer: Medicare HMO | Admitting: Hospice and Palliative Medicine

## 2019-10-24 ENCOUNTER — Telehealth: Payer: Self-pay | Admitting: *Deleted

## 2019-10-24 ENCOUNTER — Other Ambulatory Visit: Payer: Self-pay

## 2019-10-24 DIAGNOSIS — C3411 Malignant neoplasm of upper lobe, right bronchus or lung: Secondary | ICD-10-CM

## 2019-10-24 DIAGNOSIS — E039 Hypothyroidism, unspecified: Secondary | ICD-10-CM

## 2019-10-24 DIAGNOSIS — Z510 Encounter for antineoplastic radiation therapy: Secondary | ICD-10-CM | POA: Diagnosis not present

## 2019-10-24 LAB — LACTATE DEHYDROGENASE: LDH: 303 U/L — ABNORMAL HIGH (ref 98–192)

## 2019-10-24 LAB — COMPREHENSIVE METABOLIC PANEL
ALT: 17 U/L (ref 0–44)
AST: 29 U/L (ref 15–41)
Albumin: 2.8 g/dL — ABNORMAL LOW (ref 3.5–5.0)
Alkaline Phosphatase: 64 U/L (ref 38–126)
Anion gap: 11 (ref 5–15)
BUN: 36 mg/dL — ABNORMAL HIGH (ref 8–23)
CO2: 26 mmol/L (ref 22–32)
Calcium: 8.3 mg/dL — ABNORMAL LOW (ref 8.9–10.3)
Chloride: 96 mmol/L — ABNORMAL LOW (ref 98–111)
Creatinine, Ser: 1.14 mg/dL (ref 0.61–1.24)
GFR calc Af Amer: >60 mL/min (ref >60)
GFR calc non Af Amer: 59 mL/min — ABNORMAL LOW (ref >60)
Glucose, Bld: 159 mg/dL — ABNORMAL HIGH (ref 70–99)
Potassium: 5.3 mmol/L — ABNORMAL HIGH (ref 3.5–5.1)
Sodium: 133 mmol/L — ABNORMAL LOW (ref 135–145)
Total Bilirubin: 0.7 mg/dL (ref 0.3–1.2)
Total Protein: 6.4 g/dL — ABNORMAL LOW (ref 6.5–8.1)

## 2019-10-24 LAB — CBC WITH DIFFERENTIAL/PLATELET
Abs Immature Granulocytes: 1.09 10*3/uL — ABNORMAL HIGH (ref 0.00–0.07)
Basophils Absolute: 0.1 10*3/uL (ref 0.0–0.1)
Basophils Relative: 0 %
Eosinophils Absolute: 0 10*3/uL (ref 0.0–0.5)
Eosinophils Relative: 0 %
HCT: 35.3 % — ABNORMAL LOW (ref 39.0–52.0)
Hemoglobin: 11.7 g/dL — ABNORMAL LOW (ref 13.0–17.0)
Immature Granulocytes: 2 %
Lymphocytes Relative: 1 %
Lymphs Abs: 0.5 10*3/uL — ABNORMAL LOW (ref 0.7–4.0)
MCH: 29.5 pg (ref 26.0–34.0)
MCHC: 33.1 g/dL (ref 30.0–36.0)
MCV: 88.9 fL (ref 80.0–100.0)
Monocytes Absolute: 1.9 10*3/uL — ABNORMAL HIGH (ref 0.1–1.0)
Monocytes Relative: 4 %
Neutro Abs: 44.3 10*3/uL — ABNORMAL HIGH (ref 1.7–7.7)
Neutrophils Relative %: 93 %
Platelets: 413 10*3/uL — ABNORMAL HIGH (ref 150–400)
RBC: 3.97 MIL/uL — ABNORMAL LOW (ref 4.22–5.81)
RDW: 16.1 % — ABNORMAL HIGH (ref 11.5–15.5)
WBC: 48 10*3/uL — ABNORMAL HIGH (ref 4.0–10.5)
nRBC: 0 % (ref 0.0–0.2)

## 2019-10-24 LAB — TSH: TSH: 10.425 u[IU]/mL — ABNORMAL HIGH (ref 0.350–4.500)

## 2019-10-24 NOTE — Telephone Encounter (Signed)
Per Dr. B: Please inform family-there is potassium slightly elevated at 5.3; for now would recommend holding lisinopril. Continue hydration. We will recheck at next visit. Thank you   Spoke to pt's son regarding MD recommendations and he stated that pt has not been taking lisinopril since d/c from hospital. Dr. B made aware and he recommended to continue with adequate hydration at this time. Pt's son made aware. Nothing further needed at this time.

## 2019-10-24 NOTE — Progress Notes (Signed)
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  male patient with newly diagnosed advanced lung cancer is here for follow-up.  Patient continues complain of pain in his right chest wall.  Poor appetite.  Positive weight loss.  He has had multiple falls at home as per family.  Patient was evaluated by radiation oncology in interim; he is currently started on radiation for his chest wall pain.  Continues to take dexamethasone twice a day; and also Norco up to 4 times a day.  He had evaluation apparently yesterday-added MS Contin 15 twice a day.  Review of Systems  Constitutional: Positive for malaise/fatigue and weight loss. Negative for chills, diaphoresis and fever.  HENT: Negative for nosebleeds and sore throat.   Eyes: Negative for double vision.  Respiratory: Positive for sputum production and shortness of breath. Negative for cough, hemoptysis and wheezing.   Cardiovascular: Negative for chest pain, palpitations, orthopnea and leg swelling.  Gastrointestinal:  Positive for constipation. Negative for abdominal pain, blood in stool, diarrhea, heartburn, melena, nausea and vomiting.  Genitourinary: Negative for dysuria, frequency and urgency.  Musculoskeletal: Positive for back pain, joint pain and myalgias.  Skin: Negative.  Negative for itching and rash.  Neurological: Positive for weakness. Negative for dizziness, tingling, focal weakness and headaches.  Endo/Heme/Allergies: Does not bruise/bleed easily.  Psychiatric/Behavioral: Negative for depression. The patient is not nervous/anxious and does not have insomnia.      MEDICAL HISTORY:  Past Medical History:  Diagnosis Date  . Cardiomyopathy    secondary. echo-EF 25-35%. Adensoine myoview- EF 31% w/a moderately dilated ventricle. there was thinning of the inferior wall, tissue attenuation v infarct, no ischemia.   Marland Kitchen COPD (chronic obstructive pulmonary disease) ([place])   . Decreased left ventricular function   . HTN (hypertension)     SURGICAL HISTORY: No past surgical history on file.  SOCIAL HISTORY: Social History   Socioeconomic History  . Marital status: Married    Spouse name: Not on file  . Number of children: Not on file  . Years of education: Not on file  . Highest education level: Not on file  Occupational History  . Not on file  Tobacco Use  . Smoking status: Former Research scientist (life sciences)  . Smokeless tobacco: Never Used  Substance and Sexual Activity  . Alcohol use: Not Currently  . Drug use: Not Currently  . Sexual activity: Not on file  Other Topics Concern  . Not on file  Social History Narrative   Lives in [place] with wife; quit  smoking > 20 y; no alcohol; son-Ray lives 5-6 miles/in [place]. Worked in Air traffic controller; no asbestos exposure. Walks independently.    Social Determinants of Health   Financial Resource Strain:   . Difficulty of Paying Living Expenses:   Food Insecurity:   . Worried About Charity fundraiser in the Last Year:   . Arboriculturist in the  Last Year:   Transportation Needs:   . Film/video editor (Medical):   Marland Kitchen Lack of Transportation (Non-Medical):   Physical Activity:   . Days of Exercise per Week:   . Minutes of Exercise per Session:   Stress:   . Feeling of Stress :   Social Connections:   . Frequency of Communication with Friends and Family:   . Frequency of Social Gatherings with Friends and Family:   . Attends Religious Services:   . Active Member of Clubs or Organizations:   . Attends Archivist Meetings:   Marland Kitchen Marital Status:   Intimate Partner Violence:   . Fear of Current or Ex-Partner:   . Emotionally Abused:   Marland Kitchen Physically Abused:   . Sexually Abused:     FAMILY HISTORY: Family History  Problem Relation Age of Onset  . Heart attack Brother     ALLERGIES:  has No Known Allergies.  MEDICATIONS:  Current Outpatient Medications  Medication Sig Dispense Refill  . amLODipine (NORVASC) 10 MG tablet Take 10 mg by mouth daily. (Patient not taking: Reported on 10/23/2019)    . aspirin 81 MG EC tablet Take by mouth.    . dexamethasone (DECADRON) 4 MG tablet Take 1 tablet (4 mg total) by mouth 2 (two) times daily. 60 tablet 3  . dextromethorphan-guaiFENesin (MUCINEX DM) 30-600 MG 12hr tablet Take 1 tablet by mouth 2 (two) times daily as needed for cough. (Patient not taking: Reported on 10/23/2019) 30 tablet 0  . feeding supplement, ENSURE ENLIVE, (ENSURE ENLIVE) LIQD Take 237 mLs by mouth 2 (two) times daily between meals. 237 mL 12  . HYDROcodone-acetaminophen (NORCO) 10-325 MG tablet Take 1 tablet by mouth every 6 (six) hours as needed. 65 tablet 0  . levothyroxine (SYNTHROID) 50 MCG tablet Take 50 mcg by mouth daily. (Patient not taking: Reported on 10/23/2019)    . lisinopril (ZESTRIL) 10 MG tablet Take 10 mg by mouth daily. (Patient not taking: Reported on 10/23/2019)    . morphine (MS CONTIN) 15 MG 12 hr tablet Take 1 tablet (15 mg total) by mouth every 12 (twelve) hours. 30 tablet 0  .  ondansetron (ZOFRAN) 4 MG tablet Take 1 tablet (4 mg total) by mouth every 8 (eight) hours as needed for nausea or vomiting. 20 tablet 0  . rosuvastatin (CRESTOR) 10 MG tablet Take 10 mg by mouth at bedtime. (Patient not taking: Reported on 10/23/2019)     No current facility-administered medications for this visit.      Marland Kitchen  PHYSICAL EXAMINATION: ECOG PERFORMANCE STATUS: 2 - Symptomatic, <50% confined to bed  Vitals:   10/24/19 1113  BP: (!) 148/61  Pulse: 78  Temp: 97.6 F (36.4 C)  SpO2: 98%   Filed Weights   10/24/19 1113  Weight: 113 lb 6.4 oz (51.4 kg)    Physical Exam Constitutional:      Comments: Cachetic; elderly Caucasian male patient.  Accompanied by daughter..  Patient is sitting in a wheelchair.  HENT:     Head: Normocephalic and atraumatic.     Mouth/Throat:     Pharynx: No  oropharyngeal exudate.  Eyes:     Pupils: Pupils are equal, round, and reactive to light.  Cardiovascular:     Rate and Rhythm: Normal rate and regular rhythm.  Pulmonary:     Effort: No respiratory distress.     Breath sounds: No wheezing.     Comments: Decreased breath sounds bilaterally. Abdominal:     General: Bowel sounds are normal. There is no distension.     Palpations: Abdomen is soft. There is no mass.     Tenderness: There is no abdominal tenderness. There is no guarding or rebound.  Musculoskeletal:        General: No tenderness. Normal range of motion.     Cervical back: Normal range of motion and neck supple.  Skin:    General: Skin is warm.  Neurological:     Mental Status: He is alert and oriented to person, place, and time.  Psychiatric:        Mood and Affect: Affect normal.      LABORATORY DATA:  I have reviewed the data as listed Lab Results  Component Value Date   WBC 48.0 (H) 10/24/2019   HGB 11.7 (L) 10/24/2019   HCT 35.3 (L) 10/24/2019   MCV 88.9 10/24/2019   PLT 413 (H) 10/24/2019   Recent Labs    10/04/19 1030 10/05/19 0446 10/24/19 1031   NA 136 140 133*  K 4.3 4.3 5.3*  CL 103 108 96*  CO2 23 23 26   GLUCOSE 120* 83 159*  BUN 25* 24* 36*  CREATININE 0.83 0.90 1.14  CALCIUM 8.6* 8.7* 8.3*  GFRNONAA >60 >60 59*  GFRAA >60 >60 >60  PROT 6.5  --  6.4*  ALBUMIN 2.6*  --  2.8*  AST 19  --  29  ALT 12  --  17  ALKPHOS 69  --  64  BILITOT 0.6  --  0.7    RADIOGRAPHIC STUDIES: I have personally reviewed the radiological images as listed and agreed with the findings in the report. DG Chest 2 View  Result Date: 10/04/2019 CLINICAL DATA:  Shortness of breath EXAM: CHEST - 2 VIEW COMPARISON:  06/12/2006 FINDINGS: Cardiac shadow is within normal limits. Left lung is hyperinflated but clear. There is a pleural based mass lesion identified along the lateral chest wall on the right which measures 9.5 cm in greatest dimension. There are destructive changes involving the second, third and fourth ribs consistent with local invasion. Associated parenchymal density is noted likely related to lymphangitic spread in the upper lobe. No sizable effusion is seen. IMPRESSION: Pleural based mass lesion consistent with pulmonary neoplasm with erosive changes involving the second, third and fourth ribs as well as likely lymphangitic spread within the right upper lobe. CT of the chest is recommended for further evaluation. Electronically Signed   By: Inez Catalina M.D.   On: 10/04/2019 11:45   CT Angio Chest PE W and/or Wo Contrast  Result Date: 10/04/2019 CLINICAL DATA:  Pleural mass with bony destruction on recent chest x-ray, shortness of breath EXAM: CT ANGIOGRAPHY CHEST WITH CONTRAST TECHNIQUE: Multidetector CT imaging of the chest was performed using the standard protocol during bolus administration of intravenous contrast. Multiplanar CT image reconstructions and MIPs were obtained to evaluate the vascular anatomy. CONTRAST:  40m OMNIPAQUE IOHEXOL 350 MG/ML SOLN COMPARISON:  Chest x-ray from earlier in the same day FINDINGS: Cardiovascular:  Thoracic aorta and its branches demonstrate atherosclerotic calcifications. No aneurysm or dissection is seen. No cardiac enlargement is noted.  Coronary calcifications are noted. The pulmonary artery shows a normal branching pattern without intraluminal filling defect to suggest pulmonary embolism. Mediastinum/Nodes: Thoracic inlet is within normal limits. No sizable hilar or mediastinal adenopathy is noted. The esophagus as visualized is within normal limits. Lungs/Pleura: Left lung demonstrates emphysematous change and apical blebs. No focal infiltrate or effusion is seen. Small 3 mm nodule is noted in the lateral aspect of the left lower lobe best seen on image number 104 of series 6. No other definitive nodules are seen. In the right upper lobe, there is a pleural base mass lesion with chest wall invasion which measures at least 12.8 x 9.0 cm. There are erosive changes of the second third and fourth ribs related to the underlying mass lesion. Additionally there are invasion into the subscapularis rib region. Diffuse lymphangitic spread of neoplasm is noted throughout the right upper lobe similar to that seen on prior plain film examination. Calcified granuloma is noted in the right middle lobe. Large right-sided pleural effusion is seen. Upper Abdomen: Large cystic lesion is noted in the left kidney measuring 5 cm most consistent with a simple cyst. Smaller cysts are noted within the upper pole of the left kidney. Nonobstructing 4 mm stone is noted in the upper pole of the right kidney. Spleen is within normal limits. The liver and gallbladder are unremarkable. Focal soft tissue mass lesion is noted which appears to arise from the head of the pancreas. It envelops the right hepatic artery and measures approximately 6.9 x 5.1 cm. Bilateral adrenal lesions are noted suspicious for metastatic disease. Largest of these lies on the right measuring 3.1 cm. Musculoskeletal: Resorption of the second third and fourth  ribs on the right is seen related to the underlying pleural based mass lesion. An additional pleural based lesion involving the right chest wall is noted anteriorly best seen on image number 231 of series 5. This measures approximately 2.8 cm and causes some destruction of the anterior aspect of the right fifth rib. No other bony destructive lesions are seen. Review of the MIP images confirms the above findings. IMPRESSION: Large pleural base mass with right chest wall invasion and resorption of the second third fourth and fifth ribs in varying amounts. Associated right-sided pleural effusion is noted as well as significant lymphangitic spread of neoplasm in the right upper lobe. PET-CT in further workup is recommended as clinically indicated. Bilateral adrenal lesions are noted suspicious for metastatic disease. There is a soft tissue mass lesion which appears to arise from the head of the pancreas superiorly. This may represent metastatic disease although the possibility of a pancreatic primary deserves consideration. No evidence of pulmonary emboli. Aortic Atherosclerosis (ICD10-I70.0) and Emphysema (ICD10-J43.9). Electronically Signed   By: Inez Catalina M.D.   On: 10/04/2019 13:01   NM PET Image Initial (PI) Skull Base To Thigh  Result Date: 10/22/2019 CLINICAL DATA:  Initial treatment strategy for biopsy proven non-small cell right lung cancer. EXAM: NUCLEAR MEDICINE PET SKULL BASE TO THIGH TECHNIQUE: 6.5 mCi F-18 FDG was injected intravenously. Full-ring PET imaging was performed from the skull base to thigh after the radiotracer. CT data was obtained and used for attenuation correction and anatomic localization. Fasting blood glucose: 90 mg/dl COMPARISON:  10/04/2019 chest CT angiogram. FINDINGS: Mediastinal blood pool activity: SUV max 2.1 Liver activity: SUV max NA NECK: No hypermetabolic lymph nodes in the neck. Incidental CT findings: Left cerebellar hemisphere encephalomalacia. Mucoperiosteal  thickening in the right maxillary sinus. CHEST: Intensely hypermetabolic chest wall  invasive peripheral right upper lobe 14.1 x 11.2 cm lung mass with max SUV 22.9 (series 3/image 79) with lytic destruction of large portions of the right second through fifth ribs. No enlarged or hypermetabolic axillary, mediastinal or hilar lymph nodes. Incidental CT findings: Coronary atherosclerosis. Atherosclerotic nonaneurysmal thoracic aorta. Severe centrilobular and paraseptal emphysema. Subcentimeter calcified right middle lobe granuloma. Patchy regions of septal thickening and ground-glass opacity in the right upper lobe surrounding large lung mass. Tiny 3 mm peripheral left lower lobe pulmonary nodule (series 3/image 146), below PET resolution. ABDOMEN/PELVIS: Hypermetabolic 4.1 cm right adrenal mass with max SUV 8.9 (series 3/image 154). Hypermetabolic 2.2 cm left adrenal mass with max SUV 6.5 (series 3/image 162). Hypermetabolic 2.6 x 2.2 cm left retroperitoneal soft tissue mass lateral to the upper left kidney with max SUV 6.1 (series 3/image 166). Bulky hypermetabolic 5.0 cm short axis diameter porta hepatis lymph node with max SUV 8.5 (series 3/image 155). No abnormal hypermetabolic activity within the liver, pancreas or spleen. No hypermetabolic lymph nodes in the pelvis. Incidental CT findings: Simple 4.9 cm anterior interpolar left renal cyst. Atherosclerotic abdominal aorta with 2.5 cm ectatic infrarenal abdominal aorta. Marked sigmoid diverticulosis. SKELETON: Expansile hypermetabolic lytic anterior right fifth rib metastasis with max SUV 8.4 (series 3/image 123). No additional hypermetabolic skeletal lesions. Incidental CT findings: none IMPRESSION: 1. Intensely hypermetabolic chest wall invasive peripheral right upper lobe 14.1 cm lung mass with lytic destruction of portions of the right second through fifth ribs, compatible with locally advanced primary bronchogenic carcinoma. 2. Hypermetabolic bilateral  adrenal metastases. 3. Hypermetabolic bulky porta hepatis nodal metastasis. 4. Hypermetabolic left retroperitoneal soft tissue metastasis lateral to the upper left kidney. 5. Hypermetabolic expansile lytic anterior right fifth rib metastasis. 6. Tiny 3 mm peripheral left lower lobe pulmonary nodule, below PET resolution. 7. Infrarenal ectatic 2.5 cm abdominal aorta, at risk for aneurysm development. Recommend follow-up aortic ultrasound in 5 years. This recommendation follows ACR consensus guidelines: White Paper of the ACR Incidental Findings Committee II on Vascular Findings. J Am Coll Radiol 2013; 10:789-794. 8. Aortic Atherosclerosis (ICD10-I70.0) and Emphysema (ICD10-J43.9). Additional chronic findings as detailed. Electronically Signed   By: Ilona Sorrel M.D.   On: 10/22/2019 15:35   DG Chest Port 1 View  Result Date: 10/06/2019 CLINICAL DATA:  Status post chest wall biopsy EXAM: PORTABLE CHEST 1 VIEW COMPARISON:  10/04/2019 FINDINGS: Cardiac shadow is stable. Aortic calcifications are seen. Left lung remains clear. Right lung again demonstrates pleural based mass with changes of lymphangitic spread throughout the upper lobe. Bony destruction in the rib cage is seen. No pneumothorax is seen. No new focal abnormality is noted. IMPRESSION: Stable appearance of the chest when compared with the prior exam. No pneumothorax is identified. Electronically Signed   By: Inez Catalina M.D.   On: 10/06/2019 15:02   [place] CORE BIOPSY (SOFT TISSUE)  Result Date: 10/06/2019 INDICATION: Known right upper lobe mass lesion with chest wall invasion and bony destruction EXAM: ULTRASOUND-GUIDED RIGHT POSTERIOR CHEST WALL BIOPSY MEDICATIONS: None. ANESTHESIA/SEDATION: None FLUOROSCOPY TIME:  Not applicable COMPLICATIONS: None immediate. PROCEDURE: Informed written consent was obtained from the patient after a thorough discussion of the procedural risks, benefits and alternatives. All questions were addressed. Maximal Sterile  Barrier Technique was utilized including caps, mask, sterile gowns, sterile gloves, sterile drape, hand hygiene and skin antiseptic. A timeout was performed prior to the initiation of the procedure. Utilizing 1% xylocaine as local anesthetic and real-time ultrasound guidance a 17 gauge guiding needle was placed percutaneously  into posterior chest wall mass invading through the posterior ribcage on the right. Multiple 18 gauge core biopsies were then obtained. The needle was then removed and a pressure dressing placed to aid in hemostasis. Patient tolerated the procedure well and was returned his room in satisfactory condition. IMPRESSION: Successful ultrasound-guided biopsy of a right posterior chest wall mass invading through the ribcage from the upper lobe. Electronically Signed   By: Inez Catalina M.D.   On: 10/06/2019 13:59    ASSESSMENT & PLAN:   Malignant neoplasm of upper lobe of right lung ([place]) #Stage IV lung cancer-adenosquamous; right chest wall mass; right upper lobe mass; bilateral adrenal metastases; pancreatic mass.  PD-L1 50%; NGS pending  #Long discussion with patient/daughter regarding stage IV malignancy; incurable nature of his illness.   #Given PDL-50%; patient be candidate for systemic therapy with Keytruda.  I discussed the mechanism of action; The goal of therapy is palliative; and length of treatments are likely ongoing/based upon the results of the scans. Discussed the potential side effects of immunotherapy including but not limited to diarrhea; skin rash; elevated LFTs/endocrine abnormalities etc.  # Right chest wall mass-pain poorly controlled-currently started on radiation; continue dexamethasone 4 mg a day; recently started on MS Contin 15 twice a day; continue Norco 1 every 6 hours.  #Leukocytosis-paraneoplastic/underlying malignancy no signs of infection.  Poor prognostic  #Ischemic cardiomyopathy-[30 to 35% ejection fraction]-stable.  #Prognosis/goals of care: S/p  evaluation with [name].  Discussed with patient/daughter regarding overall poor prognosis; and the patient given his multiple severe comorbidities would be at high risk for side effects from underlying therapy.  Discussed regarding option of best supportive care/hospice.  Patient not interested at this time; although daughter feels hospice may be appropriate.   # DISPOSITION: #Follow-up June 28 /with [name]-MD labs CBC CMP-Dr.B  # I reviewed the blood work- with the patient in detail; also reviewed the imaging independently [as summarized above]; and with the patient in detail.      All questions were answered. The patient knows to call the clinic with any problems, questions or concerns.   Cammie Sickle, MD 10/24/2019 1:26 PM

## 2019-10-24 NOTE — Progress Notes (Signed)
  Oncology Nurse Navigator Documentation  Navigator Location: [facility]Med Onc (10/24/19 1400)   )Navigator Encounter Type: Follow-up Appt (10/24/19 1400)                     Patient Visit Type: MedOnc (10/24/19 1400)   Barriers/Navigation Needs: Pain;Morbidities/Frailty (10/24/19 1400)   Interventions: None Required (10/24/19 1400)             met with patient during follow up visit with Dr. Rogue Bussing. All questions answered during visit. Reviewed upcoming appts with patient and daughter. No other needs or questions at this time. Instructed to call back if has any further questions or needs. Pt's daughter verbalized understanding.         Time Spent with Patient: 30 (10/24/19 1400)

## 2019-10-24 NOTE — Assessment & Plan Note (Addendum)
#  Stage IV lung cancer-adenosquamous; right chest wall mass; right upper lobe mass; bilateral adrenal metastases; pancreatic mass.  PD-L1 50%; NGS pending  #Long discussion with patient/daughter regarding stage IV malignancy; incurable nature of his illness.   #Given PDL-50%; patient be candidate for systemic therapy with Keytruda.  I discussed the mechanism of action; The goal of therapy is palliative; and length of treatments are likely ongoing/based upon the results of the scans. Discussed the potential side effects of immunotherapy including but not limited to diarrhea; skin rash; elevated LFTs/endocrine abnormalities etc.  # Right chest wall mass-pain poorly controlled-currently started on radiation; continue dexamethasone 4 mg a day; recently started on MS Contin 15 twice a day; continue Norco 1 every 6 hours.  #Leukocytosis-paraneoplastic/underlying malignancy no signs of infection.  Poor prognostic  #Ischemic cardiomyopathy-[30 to 35% ejection fraction]-stable.  #Prognosis/goals of care: S/p evaluation with [name].  Discussed with patient/daughter regarding overall poor prognosis; and the patient given his multiple severe comorbidities would be at high risk for side effects from underlying therapy.  Discussed regarding option of best supportive care/hospice.  Patient not interested at this time; although daughter feels hospice may be appropriate.   # DISPOSITION: #Follow-up June 28 /with [name]-MD labs CBC CMP-Dr.B  # I reviewed the blood work- with the patient in detail; also reviewed the imaging independently [as summarized above]; and with the patient in detail.

## 2019-10-25 ENCOUNTER — Telehealth: Payer: Self-pay | Admitting: Internal Medicine

## 2019-10-25 LAB — CEA: CEA: 7.2 ng/mL — ABNORMAL HIGH (ref 0.0–4.7)

## 2019-10-25 LAB — CANCER ANTIGEN 19-9: CA 19-9: 151 U/mL — ABNORMAL HIGH (ref 0–35)

## 2019-10-25 NOTE — Telephone Encounter (Signed)
On 6/11-updated the patient regarding patient's clinical status.  Poor performance status/ frailty/multiple comorbidities/falls-high risk of complications from treatments.   For now recommend-palliative radiation chest wall/pain control.  If patient's performance status improves-single agent Keytruda could be considered.  If performance status declines-recommend hospice.  Mild hyperkalemia-recommend continued hydration.  Follow up as planned.   GB

## 2019-10-27 ENCOUNTER — Ambulatory Visit
Admission: RE | Admit: 2019-10-27 | Discharge: 2019-10-27 | Disposition: A | Discharge status: Home / Self Care | Payer: Medicare HMO | Source: Ambulatory Visit | Attending: Radiation Oncology | Admitting: Radiation Oncology

## 2019-10-27 ENCOUNTER — Inpatient Hospital Stay: Payer: Medicare HMO

## 2019-10-27 DIAGNOSIS — A419 Sepsis, unspecified organism: Secondary | ICD-10-CM | POA: Diagnosis not present

## 2019-10-27 DIAGNOSIS — I959 Hypotension, unspecified: Secondary | ICD-10-CM | POA: Diagnosis not present

## 2019-10-28 ENCOUNTER — Ambulatory Visit
Admission: RE | Admit: 2019-10-28 | Discharge: 2019-10-28 | Disposition: A | Discharge status: Home / Self Care | Payer: Medicare HMO | Source: Ambulatory Visit | Attending: Radiation Oncology | Admitting: Radiation Oncology

## 2019-10-29 ENCOUNTER — Ambulatory Visit: Payer: Medicare HMO

## 2019-10-29 ENCOUNTER — Encounter: Payer: Self-pay | Admitting: Internal Medicine

## 2019-10-29 ENCOUNTER — Telehealth: Payer: Self-pay

## 2019-10-29 NOTE — Telephone Encounter (Signed)
Nutrition Assessment   Reason for Assessment:  Referral from [place], NP for poor appetite, weight loss   ASSESSMENT:  85 year old male with advanced lung cancer.  Patient has been started on radiation.  Past medical history of CHF, HLD, COPD. Patient maybe candidate for [facility] following radiation.  If not then noted MD to recommend hospice.  Palliative care is following.    Called again this am and was able to speak with wife.  Wife reports that patient is still in the bed.  Reports no appetite for several weeks, suspect longer.  Wife reports that yesterday patient ate [place] steak and 2 cups of peaches.  Drank an ensure as well yesterday. She does not know which variety of ensure patient has only likes [employer].  "I can't force him to eat."  Unsure if patient is taking dexamethasone.  My son handles all his medication.      Medications: dexamethasone BID   Labs: reviewed   Anthropometrics:   Height: 69 inches Weight: 113 lb 6/11 5/22 123 lb per chart BMI: 16  6% weight loss in the last few weeks, significant   Estimated Energy Needs  Kcals: 1500-1700 Protein: 75-85 g Fluid: > 1.5 L   NUTRITION DIAGNOSIS: Inadequate oral intake related to cancer, likely pain as evidenced by 6% weight loss and poor po intake.     INTERVENTION:  Encouraged 350 calorie shake (ensure plus or ensure enlive) vs lower calorie variety as often as patient can drink.  Will mail coupons. Discussed ways to increase calories and protein.  Will mail High Calorie, High Protein Nutrition Therapy handout from [facility].  Wife was agreeable. Contact information will be provided in mailing.  Wife unable to write number down as outside during phone call.     MONITORING, EVALUATION, GOAL: weight trends, intake, poc   Next Visit: Thursday, July 8th PHONE f/u  Braylin Xu B. Zenia Resides, [place], [place] Registered Dietitian [phone] (pager)

## 2019-10-29 NOTE — Telephone Encounter (Signed)
Nutrition  RD planning to see patient on 6/14 following radiation but unable to due to unforeseen circumstances.  Nursing informed patient.   RD called patient this am.  No answer or option to leave voicemail.    Carlas Vandyne B. Zenia Resides, [place], [place] Registered Dietitian [phone] (pager)

## 2019-10-30 ENCOUNTER — Emergency Department: Payer: Medicare HMO

## 2019-10-30 ENCOUNTER — Encounter: Payer: Self-pay | Admitting: *Deleted

## 2019-10-30 ENCOUNTER — Inpatient Hospital Stay: Payer: Medicare HMO

## 2019-10-30 ENCOUNTER — Ambulatory Visit: Admission: RE | Admit: 2019-10-30 | Payer: Medicare HMO | Source: Ambulatory Visit

## 2019-10-30 ENCOUNTER — Inpatient Hospital Stay
Admission: EM | Admit: 2019-10-30 | Discharge: 2019-11-01 | DRG: 871 | Disposition: A | Discharge status: Hospice | Payer: Medicare HMO | Source: Ambulatory Visit | Attending: Internal Medicine | Admitting: Internal Medicine

## 2019-10-30 ENCOUNTER — Other Ambulatory Visit: Payer: Self-pay

## 2019-10-30 ENCOUNTER — Encounter: Payer: Self-pay | Admitting: Emergency Medicine

## 2019-10-30 ENCOUNTER — Ambulatory Visit: Payer: Medicare HMO

## 2019-10-30 DIAGNOSIS — A419 Sepsis, unspecified organism: Secondary | ICD-10-CM | POA: Diagnosis present

## 2019-10-30 DIAGNOSIS — Z923 Personal history of irradiation: Secondary | ICD-10-CM | POA: Diagnosis not present

## 2019-10-30 DIAGNOSIS — E861 Hypovolemia: Secondary | ICD-10-CM | POA: Diagnosis not present

## 2019-10-30 DIAGNOSIS — E43 Unspecified severe protein-calorie malnutrition: Secondary | ICD-10-CM | POA: Diagnosis present

## 2019-10-30 DIAGNOSIS — E039 Hypothyroidism, unspecified: Secondary | ICD-10-CM | POA: Diagnosis present

## 2019-10-30 DIAGNOSIS — Z8673 Personal history of transient ischemic attack (TIA), and cerebral infarction without residual deficits: Secondary | ICD-10-CM

## 2019-10-30 DIAGNOSIS — Z20822 Contact with and (suspected) exposure to covid-19: Secondary | ICD-10-CM | POA: Diagnosis present

## 2019-10-30 DIAGNOSIS — C772 Secondary and unspecified malignant neoplasm of intra-abdominal lymph nodes: Secondary | ICD-10-CM | POA: Diagnosis present

## 2019-10-30 DIAGNOSIS — K8689 Other specified diseases of pancreas: Secondary | ICD-10-CM | POA: Diagnosis present

## 2019-10-30 DIAGNOSIS — Z79890 Hormone replacement therapy (postmenopausal): Secondary | ICD-10-CM

## 2019-10-30 DIAGNOSIS — Z66 Do not resuscitate: Secondary | ICD-10-CM | POA: Diagnosis present

## 2019-10-30 DIAGNOSIS — C7951 Secondary malignant neoplasm of bone: Secondary | ICD-10-CM | POA: Diagnosis present

## 2019-10-30 DIAGNOSIS — N179 Acute kidney failure, unspecified: Secondary | ICD-10-CM | POA: Diagnosis present

## 2019-10-30 DIAGNOSIS — N171 Acute kidney failure with acute cortical necrosis: Secondary | ICD-10-CM

## 2019-10-30 DIAGNOSIS — T68XXXA Hypothermia, initial encounter: Secondary | ICD-10-CM | POA: Diagnosis present

## 2019-10-30 DIAGNOSIS — Z7982 Long term (current) use of aspirin: Secondary | ICD-10-CM

## 2019-10-30 DIAGNOSIS — C7972 Secondary malignant neoplasm of left adrenal gland: Secondary | ICD-10-CM | POA: Diagnosis present

## 2019-10-30 DIAGNOSIS — E785 Hyperlipidemia, unspecified: Secondary | ICD-10-CM | POA: Diagnosis present

## 2019-10-30 DIAGNOSIS — C7971 Secondary malignant neoplasm of right adrenal gland: Secondary | ICD-10-CM | POA: Diagnosis present

## 2019-10-30 DIAGNOSIS — C3490 Malignant neoplasm of unspecified part of unspecified bronchus or lung: Secondary | ICD-10-CM

## 2019-10-30 DIAGNOSIS — I9589 Other hypotension: Secondary | ICD-10-CM | POA: Diagnosis not present

## 2019-10-30 DIAGNOSIS — G936 Cerebral edema: Secondary | ICD-10-CM | POA: Diagnosis present

## 2019-10-30 DIAGNOSIS — C3411 Malignant neoplasm of upper lobe, right bronchus or lung: Secondary | ICD-10-CM | POA: Diagnosis present

## 2019-10-30 DIAGNOSIS — I5022 Chronic systolic (congestive) heart failure: Secondary | ICD-10-CM | POA: Diagnosis present

## 2019-10-30 DIAGNOSIS — K869 Disease of pancreas, unspecified: Secondary | ICD-10-CM | POA: Diagnosis present

## 2019-10-30 DIAGNOSIS — E875 Hyperkalemia: Secondary | ICD-10-CM | POA: Diagnosis present

## 2019-10-30 DIAGNOSIS — I10 Essential (primary) hypertension: Secondary | ICD-10-CM | POA: Diagnosis not present

## 2019-10-30 DIAGNOSIS — I959 Hypotension, unspecified: Secondary | ICD-10-CM | POA: Diagnosis present

## 2019-10-30 DIAGNOSIS — R6520 Severe sepsis without septic shock: Secondary | ICD-10-CM | POA: Diagnosis not present

## 2019-10-30 DIAGNOSIS — Z87891 Personal history of nicotine dependence: Secondary | ICD-10-CM

## 2019-10-30 DIAGNOSIS — R64 Cachexia: Secondary | ICD-10-CM | POA: Diagnosis present

## 2019-10-30 DIAGNOSIS — Z681 Body mass index (BMI) 19 or less, adult: Secondary | ICD-10-CM

## 2019-10-30 DIAGNOSIS — I429 Cardiomyopathy, unspecified: Secondary | ICD-10-CM

## 2019-10-30 DIAGNOSIS — Z8249 Family history of ischemic heart disease and other diseases of the circulatory system: Secondary | ICD-10-CM

## 2019-10-30 DIAGNOSIS — I110 Hypertensive heart disease with heart failure: Secondary | ICD-10-CM | POA: Diagnosis present

## 2019-10-30 DIAGNOSIS — I255 Ischemic cardiomyopathy: Secondary | ICD-10-CM | POA: Diagnosis present

## 2019-10-30 DIAGNOSIS — R296 Repeated falls: Secondary | ICD-10-CM | POA: Diagnosis present

## 2019-10-30 DIAGNOSIS — J449 Chronic obstructive pulmonary disease, unspecified: Secondary | ICD-10-CM | POA: Diagnosis present

## 2019-10-30 DIAGNOSIS — C7931 Secondary malignant neoplasm of brain: Secondary | ICD-10-CM | POA: Diagnosis present

## 2019-10-30 DIAGNOSIS — R531 Weakness: Secondary | ICD-10-CM

## 2019-10-30 DIAGNOSIS — R54 Age-related physical debility: Secondary | ICD-10-CM | POA: Diagnosis present

## 2019-10-30 DIAGNOSIS — R000 Tachycardia, unspecified: Secondary | ICD-10-CM | POA: Diagnosis present

## 2019-10-30 DIAGNOSIS — Z79899 Other long term (current) drug therapy: Secondary | ICD-10-CM

## 2019-10-30 LAB — BASIC METABOLIC PANEL
Anion gap: 15 (ref 5–15)
BUN: 73 mg/dL — ABNORMAL HIGH (ref 8–23)
CO2: 22 mmol/L (ref 22–32)
Calcium: 7.9 mg/dL — ABNORMAL LOW (ref 8.9–10.3)
Chloride: 96 mmol/L — ABNORMAL LOW (ref 98–111)
Creatinine, Ser: 1.97 mg/dL — ABNORMAL HIGH (ref 0.61–1.24)
GFR calc Af Amer: 35 mL/min — ABNORMAL LOW (ref >60)
GFR calc non Af Amer: 31 mL/min — ABNORMAL LOW (ref >60)
Glucose, Bld: 121 mg/dL — ABNORMAL HIGH (ref 70–99)
Potassium: 5.3 mmol/L — ABNORMAL HIGH (ref 3.5–5.1)
Sodium: 133 mmol/L — ABNORMAL LOW (ref 135–145)

## 2019-10-30 LAB — CBC
HCT: 32.7 % — ABNORMAL LOW (ref 39.0–52.0)
Hemoglobin: 11.3 g/dL — ABNORMAL LOW (ref 13.0–17.0)
MCH: 29.9 pg (ref 26.0–34.0)
MCHC: 34.6 g/dL (ref 30.0–36.0)
MCV: 86.5 fL (ref 80.0–100.0)
Platelets: 318 10*3/uL (ref 150–400)
RBC: 3.78 MIL/uL — ABNORMAL LOW (ref 4.22–5.81)
RDW: 15.8 % — ABNORMAL HIGH (ref 11.5–15.5)
WBC: 34.1 10*3/uL — ABNORMAL HIGH (ref 4.0–10.5)
nRBC: 0 % (ref 0.0–0.2)

## 2019-10-30 LAB — HEPATIC FUNCTION PANEL
ALT: 16 U/L (ref 0–44)
AST: 31 U/L (ref 15–41)
Albumin: 2.3 g/dL — ABNORMAL LOW (ref 3.5–5.0)
Alkaline Phosphatase: 76 U/L (ref 38–126)
Bilirubin, Direct: 0.3 mg/dL — ABNORMAL HIGH (ref 0.0–0.2)
Indirect Bilirubin: 0.7 mg/dL (ref 0.3–0.9)
Total Bilirubin: 1 mg/dL (ref 0.3–1.2)
Total Protein: 5.7 g/dL — ABNORMAL LOW (ref 6.5–8.1)

## 2019-10-30 LAB — LACTIC ACID, PLASMA
Lactic Acid, Venous: 2 mmol/L (ref 0.5–1.9)
Lactic Acid, Venous: 1.8 mmol/L (ref 0.5–1.9)

## 2019-10-30 LAB — TROPONIN I (HIGH SENSITIVITY): Troponin I (High Sensitivity): 38 ng/L — ABNORMAL HIGH (ref <18)

## 2019-10-30 LAB — PROCALCITONIN: Procalcitonin: 2.6 ng/mL

## 2019-10-30 LAB — SARS CORONAVIRUS 2 BY RT PCR (HOSPITAL ORDER, PERFORMED IN ~~LOC~~ HOSPITAL LAB): SARS Coronavirus 2: NEGATIVE

## 2019-10-30 MED ORDER — SODIUM CHLORIDE 0.9 % IV SOLN: 2.0000 g | Freq: Once | INTRAVENOUS | Status: DC

## 2019-10-30 MED ORDER — SODIUM CHLORIDE 0.9 % IV SOLN
2.0000 g | INTRAVENOUS | Status: DC
  Administered 2019-10-31: 2 g via INTRAVENOUS
  Filled 2019-10-30 (×2): qty 2

## 2019-10-30 MED ORDER — SODIUM CHLORIDE 0.9 % IV BOLUS
600.0000 mL | Freq: Once | INTRAVENOUS | Status: AC
  Administered 2019-10-30: 600 mL via INTRAVENOUS

## 2019-10-30 MED ORDER — SODIUM CHLORIDE 0.9 % IV SOLN: INTRAVENOUS | Status: DC

## 2019-10-30 MED ORDER — VANCOMYCIN HCL IN DEXTROSE 1-5 GM/200ML-% IV SOLN: 1000.0000 mg | INTRAVENOUS | Status: DC

## 2019-10-30 MED ORDER — GADOBUTROL 1 MMOL/ML IV SOLN
5.0000 mL | Freq: Once | INTRAVENOUS | Status: AC | PRN
  Administered 2019-10-30: 5 mL via INTRAVENOUS

## 2019-10-30 MED ORDER — SODIUM CHLORIDE 0.9 % IV SOLN
2.0000 g | Freq: Once | INTRAVENOUS | Status: AC
  Administered 2019-10-30: 2 g via INTRAVENOUS
  Filled 2019-10-30: qty 2

## 2019-10-30 MED ORDER — VANCOMYCIN HCL IN DEXTROSE 1-5 GM/200ML-% IV SOLN
1000.0000 mg | Freq: Once | INTRAVENOUS | Status: AC
  Administered 2019-10-30: 1000 mg via INTRAVENOUS
  Filled 2019-10-30: qty 200

## 2019-10-30 MED ORDER — SODIUM CHLORIDE 0.9 % IV BOLUS
1000.0000 mL | Freq: Once | INTRAVENOUS | Status: AC
  Administered 2019-10-30: 1000 mL via INTRAVENOUS

## 2019-10-30 MED ORDER — VANCOMYCIN HCL IN DEXTROSE 1-5 GM/200ML-% IV SOLN: 1000.0000 mg | Freq: Once | INTRAVENOUS | Status: DC

## 2019-10-30 MED ORDER — DEXAMETHASONE SODIUM PHOSPHATE 10 MG/ML IJ SOLN
10.0000 mg | Freq: Once | INTRAMUSCULAR | Status: AC
  Administered 2019-10-30: 10 mg via INTRAVENOUS
  Filled 2019-10-30: qty 1

## 2019-10-30 MED ORDER — METRONIDAZOLE IN NACL 5-0.79 MG/ML-% IV SOLN
500.0000 mg | Freq: Once | INTRAVENOUS | Status: AC
  Administered 2019-10-30: 500 mg via INTRAVENOUS
  Filled 2019-10-30: qty 100

## 2019-10-30 MED ORDER — ACETAMINOPHEN 650 MG RE SUPP: 650.0000 mg | Freq: Four times a day (QID) | RECTAL | Status: DC | PRN

## 2019-10-30 MED ORDER — ACETAMINOPHEN 325 MG PO TABS: 650.0000 mg | ORAL_TABLET | Freq: Four times a day (QID) | ORAL | Status: DC | PRN

## 2019-10-30 MED ORDER — ONDANSETRON HCL 4 MG PO TABS: 4.0000 mg | ORAL_TABLET | Freq: Four times a day (QID) | ORAL | Status: DC | PRN

## 2019-10-30 MED ORDER — METRONIDAZOLE IN NACL 5-0.79 MG/ML-% IV SOLN
500.0000 mg | Freq: Three times a day (TID) | INTRAVENOUS | Status: DC
  Administered 2019-10-31: 02:00:00 500 mg via INTRAVENOUS
  Filled 2019-10-30 (×4): qty 100

## 2019-10-30 MED ORDER — HEPARIN SODIUM (PORCINE) 5000 UNIT/ML IJ SOLN
5000.0000 [IU] | Freq: Three times a day (TID) | INTRAMUSCULAR | Status: DC
  Administered 2019-10-31 – 2019-11-01 (×4): 5000 [IU] via SUBCUTANEOUS
  Filled 2019-10-30 (×5): qty 1

## 2019-10-30 MED ORDER — ONDANSETRON HCL 4 MG/2ML IJ SOLN: 4.0000 mg | Freq: Four times a day (QID) | INTRAMUSCULAR | Status: DC | PRN

## 2019-10-30 MED ORDER — METRONIDAZOLE IN NACL 5-0.79 MG/ML-% IV SOLN: 500.0000 mg | Freq: Three times a day (TID) | INTRAVENOUS | Status: DC

## 2019-10-30 MED ORDER — LEVETIRACETAM IN NACL 1000 MG/100ML IV SOLN
1000.0000 mg | Freq: Once | INTRAVENOUS | Status: AC
  Administered 2019-10-30: 1000 mg via INTRAVENOUS
  Filled 2019-10-30: qty 100

## 2019-10-30 NOTE — ED Provider Notes (Signed)
[facility] Emergency Department Provider Note  ____________________________________________   First MD Initiated Contact with Patient 10/30/19 1641     (approximate)  I have reviewed the triage vital signs and the nursing notes.   HISTORY  Chief Complaint Hypotension    HPI Donald Gibbs is a 85 y.o. male with report of cardiomyopathy with a EF of 25 to 35%, stage IV lung cancer right upper lobe and no squamous, pancreatic mass on radiation who comes in from cancer center for weakness. Weakness progressing over 2 days. Not wanting to get out of bed due to weakness.  Also reports shoulder pain but according to the daughter who is in the room that is baseline for him.  Also not eating as much.  He is on dexamethasone twice a day. He takes norco for pain.  Had some abdominal pain unclear if any now.  According to daughter she is concerned he might of had some falls but he is not sure.  Weakness, severe, constant, nothing makes it better or worse.           Past Medical History:  Diagnosis Date  . Cardiomyopathy    secondary. echo-EF 25-35%. Adensoine myoview- EF 31% w/a moderately dilated ventricle. there was thinning of the inferior wall, tissue attenuation v infarct, no ischemia.   Marland Kitchen COPD (chronic obstructive pulmonary disease) ([place])   . Decreased left ventricular function   . HTN (hypertension)     Patient Active Problem List   Diagnosis Date Noted  . 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 Chronic systolic CHF (congestive heart failure) ([place]) 10/04/2019  . HLD (hyperlipidemia) 10/04/2019  . Hypothyroidism 10/04/2019  . Protein-calorie malnutrition, severe ([place]) 10/04/2019  . Malignant neoplasm of upper lobe of right lung ([place])   . Mass of pancreas   . Essential hypertension 11/05/2008  . CARDIOMYOPATHY, SECONDARY 11/05/2008  . LEFT VENTRICULAR FUNCTION, DECREASED 11/05/2008  . COPD with chronic bronchitis ([employer]) 11/05/2008    History reviewed. No  pertinent surgical history.  Prior to Admission medications   Medication Sig Start Date End Date Taking? Authorizing Provider  amLODipine (NORVASC) 10 MG tablet Take 10 mg by mouth daily. Patient not taking: Reported on 10/23/2019 06/10/19   [provider]  aspirin 81 MG EC tablet Take by mouth.    [provider]  dexamethasone (DECADRON) 4 MG tablet Take 1 tablet (4 mg total) by mouth 2 (two) times daily. 10/14/19   Cammie Sickle, MD  dextromethorphan-guaiFENesin (MUCINEX DM) 30-600 MG 12hr tablet Take 1 tablet by mouth 2 (two) times daily as needed for cough. Patient not taking: Reported on 10/23/2019 10/06/19   Lorella Nimrod, MD  feeding supplement, ENSURE ENLIVE, (ENSURE ENLIVE) LIQD Take 237 mLs by mouth 2 (two) times daily between meals. 10/07/19   Lorella Nimrod, MD  HYDROcodone-acetaminophen (NORCO) 10-325 MG tablet Take 1 tablet by mouth every 6 (six) hours as needed. 10/23/19   Borders, Kirt Boys, NP  levothyroxine (SYNTHROID) 50 MCG tablet Take 50 mcg by mouth daily. Patient not taking: Reported on 10/23/2019 06/10/19   [provider]  lisinopril (ZESTRIL) 10 MG tablet Take 10 mg by mouth daily. Patient not taking: Reported on 10/23/2019 06/10/19   [provider]  morphine (MS CONTIN) 15 MG 12 hr tablet Take 1 tablet (15 mg total) by mouth every 12 (twelve) hours. 10/23/19   Borders, Kirt Boys, NP  ondansetron (ZOFRAN) 4 MG tablet Take 1 tablet (4 mg total) by mouth every 8 (eight)  hours as needed for nausea or vomiting. 10/23/19   Borders, Kirt Boys, NP  rosuvastatin (CRESTOR) 10 MG tablet Take 10 mg by mouth at bedtime. Patient not taking: Reported on 10/23/2019 06/10/19   [provider]    Allergies Patient has no known allergies.  Family History  Problem Relation Age of Onset  . Heart attack Brother     Social History Social History   Tobacco Use  . Smoking status: Former Research scientist (life sciences)  . Smokeless tobacco: Never Used  Substance Use  Topics  . Alcohol use: Not Currently  . Drug use: Not Currently      Review of Systems Constitutional: No fever/chills, weakness, fall Eyes: No visual changes. ENT: No sore throat. Cardiovascular: Denies chest pain. Respiratory: Denies shortness of breath. Gastrointestinal:+ abd pain, not eating.  Genitourinary: Negative for dysuria. Musculoskeletal: + back pain, shoulder pain.  Skin: Negative for rash. Neurological: Negative for headaches, focal weakness or numbness. All other ROS negative ____________________________________________   PHYSICAL EXAM:  VITAL SIGNS: ED Triage Vitals  Enc Vitals Group     BP 10/30/19 1628 (!) 70/40     Pulse Rate 10/30/19 1628 94     Resp 10/30/19 1628 18     Temp 10/30/19 1628 (!) 94.1 F (34.5 C)     Temp Source 10/30/19 1628 Oral     SpO2 10/30/19 1628 96 %     Weight 10/30/19 1629 113 lb (51.3 kg)     Height 10/30/19 1629 5\' 9"  (1.753 m)     Head Circumference --      Peak Flow --      Pain Score 10/30/19 1629 0     Pain Loc --      Pain Edu? --      Excl. in [place]? --     Constitutional: cachetic looking, answers questions appropriately.  Eyes: Conjunctivae are normal. EOMI. Head: Atraumatic. Nose: No congestion/rhinnorhea. Mouth/Throat: Mucous membranes are moist.   Neck: No stridor. Trachea Midline. FROM Cardiovascular: Normal rate, regular rhythm. Grossly normal heart sounds.  Good peripheral circulation. Chest wall tenderness  Respiratory: Normal respiratory effort.  No retractions. Lungs CTAB. Gastrointestinal: Soft and nontender. No distention. No abdominal bruits.  Musculoskeletal: No lower extremity tenderness nor edema.  No joint effusions. Neurologic:  Normal speech and language. No gross focal neurologic deficits are appreciated.  Skin:  Skin is warm, dry and intact. No rash noted. Psychiatric: Mood and affect are normal. Speech and behavior are normal. GU: Deferred   ____________________________________________    LABS (all labs ordered are listed, but only abnormal results are displayed)  Labs Reviewed  BASIC METABOLIC PANEL - Abnormal; Notable for the following components:      Result Value   Sodium 133 (*)    Potassium 5.3 (*)    Chloride 96 (*)    Glucose, Bld 121 (*)    BUN 73 (*)    Creatinine, Ser 1.97 (*)    Calcium 7.9 (*)    GFR calc non Af Amer 31 (*)    GFR calc Af Amer 35 (*)    All other components within normal limits  CBC - Abnormal; Notable for the following components:   WBC 34.1 (*)    RBC 3.78 (*)    Hemoglobin 11.3 (*)    HCT 32.7 (*)    RDW 15.8 (*)    All other components within normal limits  LACTIC ACID, PLASMA - Abnormal; Notable for the following components:   Lactic Acid, Venous  2.0 (*)    All other components within normal limits  HEPATIC FUNCTION PANEL - Abnormal; Notable for the following components:   Total Protein 5.7 (*)    Albumin 2.3 (*)    Bilirubin, Direct 0.3 (*)    All other components within normal limits  TROPONIN I (HIGH SENSITIVITY) - Abnormal; Notable for the following components:   Troponin I (High Sensitivity) 38 (*)    All other components within normal limits  SARS CORONAVIRUS 2 BY RT PCR (HOSPITAL ORDER, [place] [facility])  CULTURE, BLOOD (ROUTINE X 2)  CULTURE, BLOOD (ROUTINE X 2)  URINE CULTURE  PROCALCITONIN  LACTIC ACID, PLASMA  URINALYSIS, COMPLETE (UACMP) WITH MICROSCOPIC  PROCALCITONIN   ____________________________________________   ED ECG REPORT I, Vanessa [place], the attending physician, personally viewed and interpreted this ECG.  Normal sinus rate of 94, no st elevation, twi II, III, normal intervals.  ____________________________________________  RADIOLOGY Robert Bellow, personally viewed and evaluated these images (plain radiographs) as part of my medical decision making, as well as reviewing the written report by the radiologist.  ED MD interpretation: Large mass in the right upper  lobe  Official radiology report(s): CT ABDOMEN PELVIS WO CONTRAST  Result Date: 10/30/2019 CLINICAL DATA:  Non-small cell right lung cancer, hypotension, abdominal pain, fever, multiple falls EXAM: CT CHEST, ABDOMEN AND PELVIS WITHOUT CONTRAST TECHNIQUE: Multidetector CT imaging of the chest, abdomen and pelvis was performed following the standard protocol without IV contrast. COMPARISON:  10/22/2019 FINDINGS: CT CHEST FINDINGS Cardiovascular: Heart is stable without pericardial effusion. Extensive atherosclerosis of the aorta and coronary vessels. Mediastinum/Nodes: No enlarged mediastinal, hilar, or axillary lymph nodes. Thyroid gland, trachea, and esophagus demonstrate no significant findings. Lungs/Pleura: The large infiltrative mass centered in the right upper lobe is again identified, with extension through the right chest wall and destruction of the right right second through fifth ribs. No significant change since prior study. Stable small right pleural effusion. Diffuse background emphysema. No pneumothorax. Musculoskeletal: There is bony destruction of the right second through fifth ribs related to the invasive right upper lobe mass. There are no acute displaced fractures. Reconstructed images demonstrate no additional findings. CT ABDOMEN PELVIS FINDINGS Hepatobiliary: Marked distension of the gallbladder without cholelithiasis or cholecystitis. No focal liver abnormalities on this unenhanced exam. Pancreas: Marked pancreatic atrophy with no focal parenchymal abnormalities. Spleen: Normal in size without focal abnormality. Adrenals/Urinary Tract: Stable left renal cyst. No urinary tract calculi or obstructive uropathy within either kidney. The bladder is unremarkable. Bilateral adrenal masses are compatible with known metastatic disease, stable. Stomach/Bowel: No bowel obstruction or ileus. Diffuse diverticulosis of the distal colon without diverticulitis. There is marked fecal retention. No bowel  wall thickening or inflammatory change. Vascular/Lymphatic: Large lymph node mass at the porta hepatis measures 7.1 x 5.5 cm, unchanged since recent PET scan. Metastatic deposit in the retroperitoneum lateral to the left kidney unchanged since recent PET scan, measuring up to 2.7 cm. Diffuse atherosclerosis unchanged. Reproductive: Prostate is unremarkable. Other: No free fluid or free gas. No abdominal wall hernia. Musculoskeletal: No acute or destructive bony lesions. IMPRESSION: 1. No significant interval change in the large infiltrative mass centered in the right upper lobe, with extension through the right chest wall and destruction of the right second through fifth ribs. 2. Stable small right pleural effusion. 3. Stable metastatic disease involving porta hepatis adenopathy, bilateral adrenal glands, and retroperitoneum lateral to the left kidney. 4. Marked fecal retention. No obstruction or ileus.  5. Aortic Atherosclerosis (ICD10-I70.0) and Emphysema (ICD10-J43.9). Electronically Signed   By: Randa Ngo M.D.   On: 10/30/2019 18:52   DG Wrist Complete Left  Result Date: 10/30/2019 CLINICAL DATA:  Several falls with wrist pain EXAM: LEFT WRIST - COMPLETE 3+ VIEW COMPARISON:  PET CT 10/22/2019 FINDINGS: No acute displaced fracture is visualized. Large 6 cm lytic lesion within the distal ulna with associated soft tissue mass. IMPRESSION: No acute displaced fracture. Large lytic/bony destructive lesion involving the distal ulna with associated soft tissue mass either representing metastatic disease or primary bone tumor. Electronically Signed   By: Donavan Foil M.D.   On: 10/30/2019 18:15   CT Head Wo Contrast  Result Date: 10/30/2019 CLINICAL DATA:  Multiple falls history of lung cancer EXAM: CT HEAD WITHOUT CONTRAST TECHNIQUE: Contiguous axial images were obtained from the base of the skull through the vertex without intravenous contrast. COMPARISON:  MRI 06/18/2006, CT brain 06/16/2006, PET CT  10/22/2019 FINDINGS: Brain: No acute territorial infarction is visualized. Chronic left cerebellar infarct. Suspected hypodense mass measuring 1.6 cm in the right cerebellum, series 2 image number 7. Hyperdense right frontal lobe lesion measuring 1.8 cm, series 2, image number 18. No midline shift. Extensive white matter hypodensity. Moderate atrophy. Prominent ventricles likely due to atrophy. Hypodensity within the inferior right frontal lobe white matter. Probable chronic lacunar infarcts within the bilateral basal ganglia. Vascular: No hyperdense vessels. Vertebral and carotid vascular calcification. Skull: Normal. Negative for fracture or focal lesion. Sinuses/Orbits: No acute finding. Other: None IMPRESSION: 1. 1.8 cm right frontal lobe hyperdense mass, suspicious for metastatic disease, potentially hemorrhagic metastatic focus. Additional suspected 1.6 cm right cerebellar mass. Negative for midline shift. 2. Extensive hypodensity within the bilateral white matter, much of which is suspected to be secondary to chronic small vessel ischemic change. More focal hypodensity within the inferior right frontal lobe white matter could reflect edema related to metastatic disease versus small chronic infarct. There is atrophy. 3. Chronic left cerebellar infarct Electronically Signed   By: Donavan Foil M.D.   On: 10/30/2019 18:51   CT Chest Wo Contrast  Result Date: 10/30/2019 CLINICAL DATA:  Non-small cell right lung cancer, hypotension, abdominal pain, fever, multiple falls EXAM: CT CHEST, ABDOMEN AND PELVIS WITHOUT CONTRAST TECHNIQUE: Multidetector CT imaging of the chest, abdomen and pelvis was performed following the standard protocol without IV contrast. COMPARISON:  10/22/2019 FINDINGS: CT CHEST FINDINGS Cardiovascular: Heart is stable without pericardial effusion. Extensive atherosclerosis of the aorta and coronary vessels. Mediastinum/Nodes: No enlarged mediastinal, hilar, or axillary lymph nodes. Thyroid  gland, trachea, and esophagus demonstrate no significant findings. Lungs/Pleura: The large infiltrative mass centered in the right upper lobe is again identified, with extension through the right chest wall and destruction of the right right second through fifth ribs. No significant change since prior study. Stable small right pleural effusion. Diffuse background emphysema. No pneumothorax. Musculoskeletal: There is bony destruction of the right second through fifth ribs related to the invasive right upper lobe mass. There are no acute displaced fractures. Reconstructed images demonstrate no additional findings. CT ABDOMEN PELVIS FINDINGS Hepatobiliary: Marked distension of the gallbladder without cholelithiasis or cholecystitis. No focal liver abnormalities on this unenhanced exam. Pancreas: Marked pancreatic atrophy with no focal parenchymal abnormalities. Spleen: Normal in size without focal abnormality. Adrenals/Urinary Tract: Stable left renal cyst. No urinary tract calculi or obstructive uropathy within either kidney. The bladder is unremarkable. Bilateral adrenal masses are compatible with known metastatic disease, stable. Stomach/Bowel: No bowel obstruction or ileus.  Diffuse diverticulosis of the distal colon without diverticulitis. There is marked fecal retention. No bowel wall thickening or inflammatory change. Vascular/Lymphatic: Large lymph node mass at the porta hepatis measures 7.1 x 5.5 cm, unchanged since recent PET scan. Metastatic deposit in the retroperitoneum lateral to the left kidney unchanged since recent PET scan, measuring up to 2.7 cm. Diffuse atherosclerosis unchanged. Reproductive: Prostate is unremarkable. Other: No free fluid or free gas. No abdominal wall hernia. Musculoskeletal: No acute or destructive bony lesions. IMPRESSION: 1. No significant interval change in the large infiltrative mass centered in the right upper lobe, with extension through the right chest wall and destruction  of the right second through fifth ribs. 2. Stable small right pleural effusion. 3. Stable metastatic disease involving porta hepatis adenopathy, bilateral adrenal glands, and retroperitoneum lateral to the left kidney. 4. Marked fecal retention. No obstruction or ileus. 5. Aortic Atherosclerosis (ICD10-I70.0) and Emphysema (ICD10-J43.9). Electronically Signed   By: Randa Ngo M.D.   On: 10/30/2019 18:52   CT Cervical Spine Wo Contrast  Result Date: 10/30/2019 CLINICAL DATA:  Trauma multiple fall history of lung cancer EXAM: CT CERVICAL SPINE WITHOUT CONTRAST TECHNIQUE: Multidetector CT imaging of the cervical spine was performed without intravenous contrast. Multiplanar CT image reconstructions were also generated. COMPARISON:  10/30/2019 radiographs FINDINGS: Alignment: 2 mm anterolisthesis C6 on C7. Generalized straightening of the cervical spine. Skull base and vertebrae: No acute fracture. No primary bone lesion or focal pathologic process. Soft tissues and spinal canal: No prevertebral fluid or swelling. No visible canal hematoma. Disc levels: Moderate diffuse degenerative changes throughout the cervical spine with multiple level disc space narrowing and osteophytes. Partial ankylosis at C2-C3, C5-C6. Bridging osteophyte C5 through C7. Mild chronic appearing superior endplate deformity at T1. Facet degenerative changes at multiple levels. Multiple level bilateral foraminal narrowing. Upper chest: Incompletely visualized right upper chest wall and lung mass with right second and third rib destruction. Other: None IMPRESSION: 1. No acute fracture identified. 2 mm anterolisthesis C6 on C7 likely degenerative 2. Incompletely visualized right upper chest wall mass with bony destruction of second and third ribs. Electronically Signed   By: Donavan Foil M.D.   On: 10/30/2019 19:00   DG Chest Portable 1 View  Result Date: 10/30/2019 CLINICAL DATA:  Sepsis EXAM: PORTABLE CHEST 1 VIEW COMPARISON:  Radiograph  10/06/2019 FINDINGS: Redemonstration of the pleural base mass with associated destructive changes of the adjacent ribs and chest wall involvement as well as thickened reticular opacities in the right upper lung suggestive of lymphangitic spread. Suspect a small right pleural effusion much of which is likely sub pulmonic in nature. Adjacent areas of passive atelectasis are present. Underlying infection in the right upper lung or atelectatic right lung base is difficult to exclude. Left lung remains largely clear aside from some emphysematous changes. The aorta is calcified. The remaining cardiomediastinal contours are unremarkable. No other acute or suspicious osseous lesions are identified. Degenerative changes are present in the imaged spine and shoulders. IMPRESSION: 1. Redemonstrated right pleural mass involving the right upper lobe with associated destructive changes of the adjacent ribs and chest wall involvement as well as thickened reticular opacities in the right upper lung suggestive of lymphangitic spread. Appearance is slightly progressed from comparison with increasing coalescent opacity. 2. Suspect a small right pleural effusion much of which is likely subpulmonic in nature. 3. Presence of these findings may limit detection of underlying infection. Electronically Signed   By: Lovena Le M.D.   On: 10/30/2019 17:36  ____________________________________________   PROCEDURES  Procedure(s) performed (including Critical Care):  .Critical Care Performed by: Vanessa [place], MD Authorized by: Vanessa [place], MD   Critical care provider statement:    Critical care time (minutes):  45   Critical care was necessary to treat or prevent imminent or life-threatening deterioration of the following conditions:  Sepsis   Critical care was time spent personally by me on the following activities:  Discussions with consultants, evaluation of patient's response to treatment, examination of patient,  ordering and performing treatments and interventions, ordering and review of laboratory studies, ordering and review of radiographic studies, pulse oximetry, re-evaluation of patient's condition, obtaining history from patient or surrogate and review of old charts     ____________________________________________   INITIAL IMPRESSION / ASSESSMENT AND PLAN / ED COURSE  Cire Clute Pitney was evaluated in Emergency Department on 10/30/2019 for the symptoms described in the history of present illness. He was evaluated in the context of the global COVID-19 pandemic, which necessitated consideration that the patient might be at risk for infection with the SARS-CoV-2 virus that causes COVID-19. Institutional protocols and algorithms that pertain to the evaluation of patients at risk for COVID-19 are in a state of rapid change based on information released by regulatory bodies including the CDC and federal and state organizations. These policies and algorithms were followed during the patient's care in the ED.     Pt comes in with hypotension, hypothermia concerning for sepsis. Will get labs to evaluate for UTI, Pneumonia.  Given patient's not the best historian will get CT chest to make sure no pneumonia given the x-rays so hard to read with his large lung mass and CT abdomen to make sure no acute abdominal process that could be causing infection.  Given the concern for increasing weakness in his known history of lung cancer also get CT head to make sure no evidence of intracranial hemorrhage, mass.  Given the hypothermia and hypotension sepsis alert was called patient given full 30 cc/kg of fluid that was warm, warm blankets and broad-spectrum antibiotics.  His procalcitonin was elevated consistent with concern for infection.  No clear source at this time.  On repeat evaluation patient's heart rate has come down, blood pressure stabilized and temperature has elevated and patient seems more alert.  CT imaging  however was concerning for large metastatic disease.  Possible hemorrhagic conversion.  Discussed with Dr. Cari Caraway who recommended MRI brain with and without, 1 g of Keppra and Decadron 10.  He felt that patient could stay at our hospital given it would not be amenable to surgical intervention.  We will discuss with the hospital team for admission due to concern for sepsis as well as worsening metastatic lung cancer ____________________________________________   FINAL CLINICAL IMPRESSION(S) / ED DIAGNOSES   Final diagnoses:  Sepsis, due to unspecified organism, unspecified whether acute organ dysfunction present ([place])  Weakness  Brain metastases ([place])  Malignant neoplasm of lung, unspecified laterality, unspecified part of lung ([place])      MEDICATIONS GIVEN DURING THIS VISIT:  Medications  sodium chloride 0.9 % bolus 1,000 mL (0 mLs Intravenous Stopped 10/30/19 1821)  ceFEPIme (MAXIPIME) 2 g in sodium chloride 0.9 % 100 mL IVPB (0 g Intravenous Stopped 10/30/19 1820)  metroNIDAZOLE (FLAGYL) IVPB 500 mg ( Intravenous Stopped 10/30/19 1842)  vancomycin (VANCOCIN) IVPB 1000 mg/200 mL premix ( Intravenous Stopped 10/30/19 1932)  sodium chloride 0.9 % bolus 600 mL (0 mLs Intravenous Stopped 10/30/19 2013)  levETIRAcetam (KEPPRA)  IVPB 1000 mg/100 mL premix (1,000 mg Intravenous New Bag/Given 10/30/19 2013)  dexamethasone (DECADRON) injection 10 mg (10 mg Intravenous Given 10/30/19 2011)     ED Discharge Orders    None       Note:  This document was prepared using Dragon voice recognition software and may include unintentional dictation errors.   Vanessa [place], MD 10/30/19 2033

## 2019-10-30 NOTE — Consult Note (Addendum)
Full note to follow.  85 yo male with known poorly differentiated squamous cell lung CA presents altered with possible sepsis.  During workup, head CT shows multiple probably lesions concerning for metastatic involvement.   Patient is not at neurologic baseline  A/P) 85 yo male with likely metastatic lung CA.    - sepsis tx per ED, ICU, hospitalist - start AED such as keppra 500 BID - ok to hold dexamethasone unless needed for other treatment - MRI brain with and without - Admit ICU versus floor per ED and primary team  Meade Maw MD  Addendum: MRI reviewed.  Multiple likely metastatic lesions identified.  These are more appropriate for radiation treatment.   After stabilization, please consider consultation of radiation oncology for whole brain XRT versus SRS.

## 2019-10-30 NOTE — ED Notes (Addendum)
Per son, pt has procedure and does not urinate from penis. Unable to catherize pt for urine. Pt informed to use call bell when need to use the restroom and will place on bedpan. Dr Jari Pigg notified

## 2019-10-30 NOTE — H&P (Signed)
History and Physical   Donald Gibbs QQP:[id number] DOB: 04/04/1935 DOA: 10/30/2019  Referring MD/NP/PA: Dr. Jari Pigg  PCP: [facility]   Outpatient Specialists:   Patient coming from: Dr. Rogue Bussing, oncology  Chief Complaint: Hypotension  HPI: Donald Gibbs is a 85 y.o. male with medical history significant of systolic dysfunction CHF with EF of 25 to 35%, stage IV lung cancer, right upper lung mass, pancreatic mass, history of radiation therapy, COPD, hypertension, who was brought in with significant hypotension weakness.  Patient is weak and debilitated.  He was seen and evaluated and appears to be septic.  Source of the sepsis not quite clear.  Patient is however awake and alert.  He is weak and has abdominal pain.  Patient brought in by daughter who reported progressive weakness.  Patient has recent radiation and suspected to have's decreased immunity and possibly sepsis.  He is able to communicate but not communicate effectively.  Patient will be admitted for further evaluation and treatment.  ED Course: Temperature 94.1, blood pressure 70/40, pulse 94 respirate 22 on oxygen sats 92% room air.  White count 34.1 hemoglobin 10.3 and platelet count 318.  Sodium 133 potassium 5.3 chloride 96 CO2 22 BUN 73 creatinine 1.97 calcium 7.9.  Procalcitonin 2.6.  Glucose 121.  Lactic acid 1.8.  Patient hydrated and is being admitted for further evaluation and treatment.  Oncology consulted due to new findings of brain  Metastasis.  CT abdomen pelvis showed no significant change in the large infiltrative mass in the right upper lobe with extension through the right chest wall and destruction of the right second through fifth ribs.  There is small stable right pleural effusion stable metastatic disease involving porta hepatis adenopathy also.  No acute fracture seen on CT cervical spine.  Head CT without contrast shows 1.8 cm right frontal lobe hyperdense mass suspicious for metastatic  disease potentially hemorrhagic metastatic focus.  Also 1.6 cm right cerebellar mass negative for midline shift.  Extensive hypodensity within the bilateral white matter much of which is suspected to be secondary to chronic small vessel ischemic change.  MRI of the brain showed 2 cm right cerebellar metastasis.  Also 1.7 cm right posterior parietal metastasis one-point centimeter right frontoparietal junction which is all of these with petechial blood products and surrounding vasogenic edema.  Review of Systems: As per HPI otherwise 10 point review of systems negative.    Past Medical History:  Diagnosis Date  . Cardiomyopathy    secondary. echo-EF 25-35%. Adensoine myoview- EF 31% w/a moderately dilated ventricle. there was thinning of the inferior wall, tissue attenuation v infarct, no ischemia.   Marland Kitchen COPD (chronic obstructive pulmonary disease) ([place])   . Decreased left ventricular function   . HTN (hypertension)     History reviewed. No pertinent surgical history.   reports that he has quit smoking. He has never used smokeless tobacco. He reports previous alcohol use. He reports previous drug use.  No Known Allergies  Family History  Problem Relation Age of Onset  . Heart attack Brother      Prior to Admission medications   Medication Sig Start Date End Date Taking? Authorizing Provider  dexamethasone (DECADRON) 4 MG tablet Take 1 tablet (4 mg total) by mouth 2 (two) times daily. 10/14/19  Yes Cammie Sickle, MD  feeding supplement, ENSURE ENLIVE, (ENSURE ENLIVE) LIQD Take 237 mLs by mouth 2 (two) times daily between meals. Patient taking differently: Take 237 mLs by mouth 2 (two) times  daily between meals. Loves Strawberry 10/07/19  Yes Lorella Nimrod, MD  HYDROcodone-acetaminophen (NORCO) 10-325 MG tablet Take 1 tablet by mouth every 6 (six) hours as needed. 10/23/19  Yes Borders, Kirt Boys, NP  morphine (MS CONTIN) 15 MG 12 hr tablet Take 1 tablet (15 mg total) by mouth every 12  (twelve) hours. 10/23/19  Yes Borders, Kirt Boys, NP  ondansetron (ZOFRAN) 4 MG tablet Take 1 tablet (4 mg total) by mouth every 8 (eight) hours as needed for nausea or vomiting. 10/23/19  Yes Borders, Kirt Boys, NP    Physical Exam: Vitals:   10/30/19 1830 10/30/19 1834 10/30/19 1930 10/30/19 2016  BP: (!) 149/58  (!) 144/72 137/66  Pulse: 81     Resp:   (!) 21 16  Temp:  97.6 F (36.4 C)    TempSrc:  Oral    SpO2: 94%     Weight:      Height:          Constitutional: Severe cachexia, chronically ill looking, mildly confused Vitals:   10/30/19 1830 10/30/19 1834 10/30/19 1930 10/30/19 2016  BP: (!) 149/58  (!) 144/72 137/66  Pulse: 81     Resp:   (!) 21 16  Temp:  97.6 F (36.4 C)    TempSrc:  Oral    SpO2: 94%     Weight:      Height:       Eyes: PERRL, lids and conjunctivae normal ENMT: Mucous membranes are dry. Posterior pharynx clear of any exudate or lesions.Normal dentition.  Neck: normal, supple, no masses, no thyromegaly Respiratory: clear to auscultation bilaterally, no wheezing, no crackles. Normal respiratory effort. No accessory muscle use.  Bilateral rhonchi Cardiovascular: Regular rate and rhythm, no murmurs / rubs / gallops. No extremity edema. 2+ pedal pulses. No carotid bruits.  Abdomen: no tenderness, no masses palpated. No hepatosplenomegaly. Bowel sounds positive.  Cachectic abdomen Musculoskeletal: no clubbing / cyanosis. No joint deformity upper and lower extremities. Good ROM, no contractures.  Decreased muscle tone Skin: no rashes, lesions, ulcers. No induration Neurologic: CN 2-12 grossly intact. Sensation intact, DTR normal. Strength 5/5 in all 4.  Psychiatric: Mild confusion,   Labs on Admission: I have personally reviewed following labs and imaging studies  CBC: Recent Labs  Lab 10/24/19 1031 10/30/19 1642  WBC 48.0* 34.1*  NEUTROABS 44.3*  --   HGB 11.7* 11.3*  HCT 35.3* 32.7*  MCV 88.9 86.5  PLT 413* 201   Basic Metabolic  Panel: Recent Labs  Lab 10/24/19 1031 10/30/19 1642  NA 133* 133*  K 5.3* 5.3*  CL 96* 96*  CO2 26 22  GLUCOSE 159* 121*  BUN 36* 73*  CREATININE 1.14 1.97*  CALCIUM 8.3* 7.9*   GFR: Estimated Creatinine Clearance: 20.6 mL/min (A) (by C-G formula based on SCr of 1.97 mg/dL (H)). Liver Function Tests: Recent Labs  Lab 10/24/19 1031 10/30/19 1642  AST 29 31  ALT 17 16  ALKPHOS 64 76  BILITOT 0.7 1.0  PROT 6.4* 5.7*  ALBUMIN 2.8* 2.3*   No results for input(s): LIPASE, AMYLASE in the last 168 hours. No results for input(s): AMMONIA in the last 168 hours. Coagulation Profile: No results for input(s): INR, PROTIME in the last 168 hours. Cardiac Enzymes: No results for input(s): CKTOTAL, CKMB, CKMBINDEX, TROPONINI in the last 168 hours. BNP (last 3 results) No results for input(s): PROBNP in the last 8760 hours. HbA1C: No results for input(s): HGBA1C in the last 72 hours. CBG: No  results for input(s): GLUCAP in the last 168 hours. Lipid Profile: No results for input(s): CHOL, HDL, LDLCALC, TRIG, CHOLHDL, LDLDIRECT in the last 72 hours. Thyroid Function Tests: No results for input(s): TSH, T4TOTAL, FREET4, T3FREE, THYROIDAB in the last 72 hours. Anemia Panel: No results for input(s): VITAMINB12, FOLATE, FERRITIN, TIBC, IRON, RETICCTPCT in the last 72 hours. Urine analysis: No results found for: COLORURINE, APPEARANCEUR, LABSPEC, PHURINE, GLUCOSEU, HGBUR, BILIRUBINUR, KETONESUR, PROTEINUR, UROBILINOGEN, NITRITE, LEUKOCYTESUR Sepsis Labs: @LABRCNTIP (procalcitonin:4,lacticidven:4) ) Recent Results (from the past 240 hour(s))  SARS Coronavirus 2 by RT PCR (hospital order, performed in [facility] hospital lab) Nasopharyngeal Nasopharyngeal Swab     Status: None   Collection Time: 10/30/19  5:38 PM   Specimen: Nasopharyngeal Swab  Result Value Ref Range Status   SARS Coronavirus 2 NEGATIVE NEGATIVE Final    Comment: (NOTE) SARS-CoV-2 target nucleic acids are NOT  DETECTED.  The SARS-CoV-2 RNA is generally detectable in upper and lower respiratory specimens during the acute phase of infection. The lowest concentration of SARS-CoV-2 viral copies this assay can detect is 250 copies / mL. A negative result does not preclude SARS-CoV-2 infection and should not be used as the sole basis for treatment or other patient management decisions.  A negative result may occur with improper specimen collection / handling, submission of specimen other than nasopharyngeal swab, presence of viral mutation(s) within the areas targeted by this assay, and inadequate number of viral copies (<250 copies / mL). A negative result must be combined with clinical observations, patient history, and epidemiological information.  Fact Sheet for Patients:   StrictlyIdeas.no  Fact Sheet for Healthcare Providers: BankingDealers.co.za  This test is not yet approved or  cleared by the [place] FDA and has been authorized for detection and/or diagnosis of SARS-CoV-2 by FDA under an Emergency Use Authorization (EUA).  This EUA will remain in effect (meaning this test can be used) for the duration of the COVID-19 declaration under Section 564(b)(1) of the Act, 21 U.S.C. section 360bbb-3(b)(1), unless the authorization is terminated or revoked sooner.  Performed at [facility], [address] [place], [place] [address]      Radiological Exams on Admission: CT ABDOMEN PELVIS WO CONTRAST  Result Date: 10/30/2019 CLINICAL DATA:  Non-small cell right lung cancer, hypotension, abdominal pain, fever, multiple falls EXAM: CT CHEST, ABDOMEN AND PELVIS WITHOUT CONTRAST TECHNIQUE: Multidetector CT imaging of the chest, abdomen and pelvis was performed following the standard protocol without IV contrast. COMPARISON:  10/22/2019 FINDINGS: CT CHEST FINDINGS Cardiovascular: Heart is stable without pericardial effusion. Extensive  atherosclerosis of the aorta and coronary vessels. Mediastinum/Nodes: No enlarged mediastinal, hilar, or axillary lymph nodes. Thyroid gland, trachea, and esophagus demonstrate no significant findings. Lungs/Pleura: The large infiltrative mass centered in the right upper lobe is again identified, with extension through the right chest wall and destruction of the right right second through fifth ribs. No significant change since prior study. Stable small right pleural effusion. Diffuse background emphysema. No pneumothorax. Musculoskeletal: There is bony destruction of the right second through fifth ribs related to the invasive right upper lobe mass. There are no acute displaced fractures. Reconstructed images demonstrate no additional findings. CT ABDOMEN PELVIS FINDINGS Hepatobiliary: Marked distension of the gallbladder without cholelithiasis or cholecystitis. No focal liver abnormalities on this unenhanced exam. Pancreas: Marked pancreatic atrophy with no focal parenchymal abnormalities. Spleen: Normal in size without focal abnormality. Adrenals/Urinary Tract: Stable left renal cyst. No urinary tract calculi or obstructive uropathy within either kidney. The bladder  is unremarkable. Bilateral adrenal masses are compatible with known metastatic disease, stable. Stomach/Bowel: No bowel obstruction or ileus. Diffuse diverticulosis of the distal colon without diverticulitis. There is marked fecal retention. No bowel wall thickening or inflammatory change. Vascular/Lymphatic: Large lymph node mass at the porta hepatis measures 7.1 x 5.5 cm, unchanged since recent PET scan. Metastatic deposit in the retroperitoneum lateral to the left kidney unchanged since recent PET scan, measuring up to 2.7 cm. Diffuse atherosclerosis unchanged. Reproductive: Prostate is unremarkable. Other: No free fluid or free gas. No abdominal wall hernia. Musculoskeletal: No acute or destructive bony lesions. IMPRESSION: 1. No significant  interval change in the large infiltrative mass centered in the right upper lobe, with extension through the right chest wall and destruction of the right second through fifth ribs. 2. Stable small right pleural effusion. 3. Stable metastatic disease involving porta hepatis adenopathy, bilateral adrenal glands, and retroperitoneum lateral to the left kidney. 4. Marked fecal retention. No obstruction or ileus. 5. Aortic Atherosclerosis (ICD10-I70.0) and Emphysema (ICD10-J43.9). Electronically Signed   By: Randa Ngo M.D.   On: 10/30/2019 18:52   DG Wrist Complete Left  Result Date: 10/30/2019 CLINICAL DATA:  Several falls with wrist pain EXAM: LEFT WRIST - COMPLETE 3+ VIEW COMPARISON:  PET CT 10/22/2019 FINDINGS: No acute displaced fracture is visualized. Large 6 cm lytic lesion within the distal ulna with associated soft tissue mass. IMPRESSION: No acute displaced fracture. Large lytic/bony destructive lesion involving the distal ulna with associated soft tissue mass either representing metastatic disease or primary bone tumor. Electronically Signed   By: Donavan Foil M.D.   On: 10/30/2019 18:15   CT Head Wo Contrast  Result Date: 10/30/2019 CLINICAL DATA:  Multiple falls history of lung cancer EXAM: CT HEAD WITHOUT CONTRAST TECHNIQUE: Contiguous axial images were obtained from the base of the skull through the vertex without intravenous contrast. COMPARISON:  MRI 06/18/2006, CT brain 06/16/2006, PET CT 10/22/2019 FINDINGS: Brain: No acute territorial infarction is visualized. Chronic left cerebellar infarct. Suspected hypodense mass measuring 1.6 cm in the right cerebellum, series 2 image number 7. Hyperdense right frontal lobe lesion measuring 1.8 cm, series 2, image number 18. No midline shift. Extensive white matter hypodensity. Moderate atrophy. Prominent ventricles likely due to atrophy. Hypodensity within the inferior right frontal lobe white matter. Probable chronic lacunar infarcts within the  bilateral basal ganglia. Vascular: No hyperdense vessels. Vertebral and carotid vascular calcification. Skull: Normal. Negative for fracture or focal lesion. Sinuses/Orbits: No acute finding. Other: None IMPRESSION: 1. 1.8 cm right frontal lobe hyperdense mass, suspicious for metastatic disease, potentially hemorrhagic metastatic focus. Additional suspected 1.6 cm right cerebellar mass. Negative for midline shift. 2. Extensive hypodensity within the bilateral white matter, much of which is suspected to be secondary to chronic small vessel ischemic change. More focal hypodensity within the inferior right frontal lobe white matter could reflect edema related to metastatic disease versus small chronic infarct. There is atrophy. 3. Chronic left cerebellar infarct Electronically Signed   By: Donavan Foil M.D.   On: 10/30/2019 18:51   CT Chest Wo Contrast  Result Date: 10/30/2019 CLINICAL DATA:  Non-small cell right lung cancer, hypotension, abdominal pain, fever, multiple falls EXAM: CT CHEST, ABDOMEN AND PELVIS WITHOUT CONTRAST TECHNIQUE: Multidetector CT imaging of the chest, abdomen and pelvis was performed following the standard protocol without IV contrast. COMPARISON:  10/22/2019 FINDINGS: CT CHEST FINDINGS Cardiovascular: Heart is stable without pericardial effusion. Extensive atherosclerosis of the aorta and coronary vessels. Mediastinum/Nodes: No enlarged mediastinal,  hilar, or axillary lymph nodes. Thyroid gland, trachea, and esophagus demonstrate no significant findings. Lungs/Pleura: The large infiltrative mass centered in the right upper lobe is again identified, with extension through the right chest wall and destruction of the right right second through fifth ribs. No significant change since prior study. Stable small right pleural effusion. Diffuse background emphysema. No pneumothorax. Musculoskeletal: There is bony destruction of the right second through fifth ribs related to the invasive right  upper lobe mass. There are no acute displaced fractures. Reconstructed images demonstrate no additional findings. CT ABDOMEN PELVIS FINDINGS Hepatobiliary: Marked distension of the gallbladder without cholelithiasis or cholecystitis. No focal liver abnormalities on this unenhanced exam. Pancreas: Marked pancreatic atrophy with no focal parenchymal abnormalities. Spleen: Normal in size without focal abnormality. Adrenals/Urinary Tract: Stable left renal cyst. No urinary tract calculi or obstructive uropathy within either kidney. The bladder is unremarkable. Bilateral adrenal masses are compatible with known metastatic disease, stable. Stomach/Bowel: No bowel obstruction or ileus. Diffuse diverticulosis of the distal colon without diverticulitis. There is marked fecal retention. No bowel wall thickening or inflammatory change. Vascular/Lymphatic: Large lymph node mass at the porta hepatis measures 7.1 x 5.5 cm, unchanged since recent PET scan. Metastatic deposit in the retroperitoneum lateral to the left kidney unchanged since recent PET scan, measuring up to 2.7 cm. Diffuse atherosclerosis unchanged. Reproductive: Prostate is unremarkable. Other: No free fluid or free gas. No abdominal wall hernia. Musculoskeletal: No acute or destructive bony lesions. IMPRESSION: 1. No significant interval change in the large infiltrative mass centered in the right upper lobe, with extension through the right chest wall and destruction of the right second through fifth ribs. 2. Stable small right pleural effusion. 3. Stable metastatic disease involving porta hepatis adenopathy, bilateral adrenal glands, and retroperitoneum lateral to the left kidney. 4. Marked fecal retention. No obstruction or ileus. 5. Aortic Atherosclerosis (ICD10-I70.0) and Emphysema (ICD10-J43.9). Electronically Signed   By: Randa Ngo M.D.   On: 10/30/2019 18:52   CT Cervical Spine Wo Contrast  Result Date: 10/30/2019 CLINICAL DATA:  Trauma multiple  fall history of lung cancer EXAM: CT CERVICAL SPINE WITHOUT CONTRAST TECHNIQUE: Multidetector CT imaging of the cervical spine was performed without intravenous contrast. Multiplanar CT image reconstructions were also generated. COMPARISON:  10/30/2019 radiographs FINDINGS: Alignment: 2 mm anterolisthesis C6 on C7. Generalized straightening of the cervical spine. Skull base and vertebrae: No acute fracture. No primary bone lesion or focal pathologic process. Soft tissues and spinal canal: No prevertebral fluid or swelling. No visible canal hematoma. Disc levels: Moderate diffuse degenerative changes throughout the cervical spine with multiple level disc space narrowing and osteophytes. Partial ankylosis at C2-C3, C5-C6. Bridging osteophyte C5 through C7. Mild chronic appearing superior endplate deformity at T1. Facet degenerative changes at multiple levels. Multiple level bilateral foraminal narrowing. Upper chest: Incompletely visualized right upper chest wall and lung mass with right second and third rib destruction. Other: None IMPRESSION: 1. No acute fracture identified. 2 mm anterolisthesis C6 on C7 likely degenerative 2. Incompletely visualized right upper chest wall mass with bony destruction of second and third ribs. Electronically Signed   By: Donavan Foil M.D.   On: 10/30/2019 19:00   DG Chest Portable 1 View  Result Date: 10/30/2019 CLINICAL DATA:  Sepsis EXAM: PORTABLE CHEST 1 VIEW COMPARISON:  Radiograph 10/06/2019 FINDINGS: Redemonstration of the pleural base mass with associated destructive changes of the adjacent ribs and chest wall involvement as well as thickened reticular opacities in the right upper lung suggestive of  lymphangitic spread. Suspect a small right pleural effusion much of which is likely sub pulmonic in nature. Adjacent areas of passive atelectasis are present. Underlying infection in the right upper lung or atelectatic right lung base is difficult to exclude. Left lung remains  largely clear aside from some emphysematous changes. The aorta is calcified. The remaining cardiomediastinal contours are unremarkable. No other acute or suspicious osseous lesions are identified. Degenerative changes are present in the imaged spine and shoulders. IMPRESSION: 1. Redemonstrated right pleural mass involving the right upper lobe with associated destructive changes of the adjacent ribs and chest wall involvement as well as thickened reticular opacities in the right upper lung suggestive of lymphangitic spread. Appearance is slightly progressed from comparison with increasing coalescent opacity. 2. Suspect a small right pleural effusion much of which is likely subpulmonic in nature. 3. Presence of these findings may limit detection of underlying infection. Electronically Signed   By: Lovena Le M.D.   On: 10/30/2019 17:36      Assessment/Plan Principal Problem:   Hypotension Active Problems:   Essential hypertension   Secondary cardiomyopathy (HCC)   COPD with chronic bronchitis (HCC)   Chronic systolic CHF (congestive heart failure) (HCC)   HLD (hyperlipidemia)   Hypothyroidism   Malignant neoplasm of upper lobe of right lung (HCC)   Brain metastases (HCC)   ARF (acute renal failure) (HCC)   Hyperkalemia     #1 hypotension: Suspected sepsis due to weakness and immunocompromise status.  Aggressive hydration and IV antibiotics for sepsis of unknown course.  Blood cultures to be obtained patient to be monitored.  #2 lung cancer: Continue with oncology  #3 essential hypertension: Blood pressure is low.  Hold antihypertensives until pressure stabilizes.  #4 acute kidney injury: BUN/creatinine appears to be stable but elevated.  Continue IV fluids  #5 systolic dysfunction CHF: Gentle hydration because of systolic dysfunction.  #6 hyperkalemia: Patient to be monitored.  Repeat blood work.  May use Kayexalate if still elevated  #7 COPD: Stable no exacerbation  #8  hypothyroidism: Continue to replete  #9 new brain metastasis: MRI ordered and results are active.  Oncology to decide on next steps.   DVT prophylaxis: Lovenox Code Status: Full code Family Communication: Daughter Disposition Plan: To be determined Consults called: Oncology Admission status: Inpatient  Severity of Illness: The appropriate patient status for this patient is INPATIENT. Inpatient status is judged to be reasonable and necessary in order to provide the required intensity of service to ensure the patient's safety. The patient's presenting symptoms, physical exam findings, and initial radiographic and laboratory data in the context of their chronic comorbidities is felt to place them at high risk for further clinical deterioration. Furthermore, it is not anticipated that the patient will be medically stable for discharge from the hospital within 2 midnights of admission. The following factors support the patient status of inpatient.   " The patient's presenting symptoms include hypertension and weakness. " The worrisome physical exam findings include severe cachexia. " The initial radiographic and laboratory data are worrisome because of evidence of sepsis. " The chronic co-morbidities include metastatic lung cancer.   * I certify that at the point of admission it is my clinical judgment that the patient will require inpatient hospital care spanning beyond 2 midnights from the point of admission due to high intensity of service, high risk for further deterioration and high frequency of surveillance required.Barbette Merino MD [facility] Pager [phone]  If 7PM-7AM, please  contact night-coverage [email or web address] Password [facility]  10/30/2019, 8:45 PM

## 2019-10-30 NOTE — Progress Notes (Signed)
CODE SEPSIS - PHARMACY COMMUNICATION  **Broad Spectrum Antibiotics should be administered within 1 hour of Sepsis diagnosis**  Time Code Sepsis Called/Page Received: 1706  Antibiotics Ordered: vanc/cefepime/metronidazole  Time of 1st antibiotic administration: 1721  Additional action taken by pharmacy: NA  If necessary, Name of Provider/Nurse Contacted: NA    Tawnya Crook ,PharmD Clinical Pharmacist  10/30/2019  7:41 PM

## 2019-10-30 NOTE — ED Triage Notes (Signed)
Patient presents to the ED with hypotension from the [facility].  Patient is complaining of weakness and dizziness.  Patient looks pale, thin and frail.

## 2019-10-30 NOTE — ED Notes (Signed)
Attempted in and out cath x2 unsuccessful, condom cath placed on pt. Dr Jari Pigg aware

## 2019-10-30 NOTE — Progress Notes (Signed)
Pharmacy Antibiotic Note  Donald Gibbs is a 85 y.o. male admitted on 10/30/2019 with sepsis.  Pharmacy has been consulted for Vancomycin , Cefepime  dosing.  Plan: Cefepime 2 gm IV X 1 given in ED on 6/17 @ 17:21.  Cefepime 2 gm IV Q24H ordered to start 6/18 @ 1700.   Vancomycin 1 gm IV X 1 6/17 @ 1828. Vancomycin 1 gm IV Q48H ordered to start 6/19 @ 1800 per nomogram.   Height: 5\' 9"  (175.3 cm) Weight: 51.3 kg (113 lb) IBW/kg (Calculated) : 70.7  Temp (24hrs), Avg:95.7 F (35.4 C), Min:94.1 F (34.5 C), Max:97.6 F (36.4 C)  Recent Labs  Lab 10/24/19 1031 10/30/19 1642 10/30/19 1714 10/30/19 1825  WBC 48.0* 34.1*  --   --   CREATININE 1.14 1.97*  --   --   LATICACIDVEN  --   --  2.0* 1.8    Estimated Creatinine Clearance: 20.6 mL/min (A) (by C-G formula based on SCr of 1.97 mg/dL (H)).    No Known Allergies  Antimicrobials this admission:   >>    >>   Dose adjustments this admission:   Microbiology results:  BCx:  UCx:   Sputum:    MRSA PCR:   Thank you for allowing pharmacy to be a part of this patient's care.  Tanasha Menees D 10/30/2019 9:03 PM

## 2019-10-30 NOTE — ED Notes (Signed)
Dr Jari Pigg notified of lactic 2.0. no new orders at this time

## 2019-10-30 NOTE — Progress Notes (Signed)
PHARMACY -  BRIEF ANTIBIOTIC NOTE   Pharmacy has received consult(s) for vancomycin and cefepime from an ED provider.  The patient's profile has been reviewed for ht/wt/allergies/indication/available labs.    One time order(s) placed for vanc 1 g + cefepime 2 g  Further antibiotics/pharmacy consults should be ordered by admitting physician if indicated.                       Thank you,  Tawnya Crook, PharmD 10/30/2019  5:08 PM

## 2019-10-30 NOTE — ED Notes (Signed)
Warm bolus started per dr Jari Pigg

## 2019-10-30 NOTE — Progress Notes (Signed)
Pt not feeling well.   C/o feeling weak, not eating and drinking at home, appears pale in color.    BP low.   Patient son is taking him to the ER for evaluation.

## 2019-10-30 NOTE — ED Triage Notes (Signed)
First Nurse Note:  Patient sent from [place] [facility] after treatment due to low blood pressure of : 74/37 per patient's son.  Patient's blood pressure is currently 85/42.

## 2019-10-30 NOTE — ED Notes (Signed)
Pt to MRI

## 2019-10-30 NOTE — ED Notes (Signed)
Attempted to call report to floor, RN to call back with questions

## 2019-10-30 NOTE — Progress Notes (Signed)
Notified bedside nurse of need to administer fluid bolus, pt needs 1539 cc.

## 2019-10-30 NOTE — ED Notes (Signed)
Warm blankets placed on pt. 

## 2019-10-31 ENCOUNTER — Ambulatory Visit: Payer: Medicare HMO

## 2019-10-31 ENCOUNTER — Ambulatory Visit: Payer: Medicare HMO | Attending: Radiation Oncology | Admitting: Radiation Oncology

## 2019-10-31 DIAGNOSIS — C7931 Secondary malignant neoplasm of brain: Secondary | ICD-10-CM

## 2019-10-31 DIAGNOSIS — C3411 Malignant neoplasm of upper lobe, right bronchus or lung: Secondary | ICD-10-CM

## 2019-10-31 DIAGNOSIS — E875 Hyperkalemia: Secondary | ICD-10-CM

## 2019-10-31 DIAGNOSIS — A419 Sepsis, unspecified organism: Secondary | ICD-10-CM

## 2019-10-31 LAB — COMPREHENSIVE METABOLIC PANEL
ALT: 15 U/L (ref 0–44)
AST: 32 U/L (ref 15–41)
Albumin: 2.3 g/dL — ABNORMAL LOW (ref 3.5–5.0)
Alkaline Phosphatase: 78 U/L (ref 38–126)
Anion gap: 11 (ref 5–15)
BUN: 61 mg/dL — ABNORMAL HIGH (ref 8–23)
CO2: 21 mmol/L — ABNORMAL LOW (ref 22–32)
Calcium: 7.5 mg/dL — ABNORMAL LOW (ref 8.9–10.3)
Chloride: 102 mmol/L (ref 98–111)
Creatinine, Ser: 1.43 mg/dL — ABNORMAL HIGH (ref 0.61–1.24)
GFR calc Af Amer: 52 mL/min — ABNORMAL LOW (ref >60)
GFR calc non Af Amer: 45 mL/min — ABNORMAL LOW (ref >60)
Glucose, Bld: 113 mg/dL — ABNORMAL HIGH (ref 70–99)
Potassium: 5.3 mmol/L — ABNORMAL HIGH (ref 3.5–5.1)
Sodium: 134 mmol/L — ABNORMAL LOW (ref 135–145)
Total Bilirubin: 0.8 mg/dL (ref 0.3–1.2)
Total Protein: 5.7 g/dL — ABNORMAL LOW (ref 6.5–8.1)

## 2019-10-31 LAB — CBC
HCT: 31.8 % — ABNORMAL LOW (ref 39.0–52.0)
Hemoglobin: 10.8 g/dL — ABNORMAL LOW (ref 13.0–17.0)
MCH: 29.4 pg (ref 26.0–34.0)
MCHC: 34 g/dL (ref 30.0–36.0)
MCV: 86.6 fL (ref 80.0–100.0)
Platelets: 278 10*3/uL (ref 150–400)
RBC: 3.67 MIL/uL — ABNORMAL LOW (ref 4.22–5.81)
RDW: 15.7 % — ABNORMAL HIGH (ref 11.5–15.5)
WBC: 33.9 10*3/uL — ABNORMAL HIGH (ref 4.0–10.5)
nRBC: 0 % (ref 0.0–0.2)

## 2019-10-31 LAB — PROTIME-INR
INR: 1.1 (ref 0.8–1.2)
Prothrombin Time: 13.4 seconds (ref 11.4–15.2)

## 2019-10-31 LAB — MRSA PCR SCREENING: MRSA by PCR: NEGATIVE

## 2019-10-31 LAB — PROCALCITONIN: Procalcitonin: 0.99 ng/mL

## 2019-10-31 LAB — CORTISOL-AM, BLOOD: Cortisol - AM: 10.6 ug/dL (ref 6.7–22.6)

## 2019-10-31 LAB — GLUCOSE, CAPILLARY
Glucose-Capillary: 115 mg/dL — ABNORMAL HIGH (ref 70–99)
Glucose-Capillary: 124 mg/dL — ABNORMAL HIGH (ref 70–99)

## 2019-10-31 MED ORDER — ALUM & MAG HYDROXIDE-SIMETH 200-200-20 MG/5ML PO SUSP
30.0000 mL | Freq: Three times a day (TID) | ORAL | Status: DC | PRN
  Administered 2019-10-31: 30 mL via ORAL
  Filled 2019-10-31: qty 30

## 2019-10-31 MED ORDER — HYDROCODONE-ACETAMINOPHEN 10-325 MG PO TABS
1.0000 | ORAL_TABLET | Freq: Four times a day (QID) | ORAL | Status: DC | PRN
  Administered 2019-10-31 – 2019-11-01 (×3): 1 via ORAL
  Filled 2019-10-31 (×3): qty 1

## 2019-10-31 MED ORDER — DEXAMETHASONE SODIUM PHOSPHATE 4 MG/ML IJ SOLN
4.0000 mg | Freq: Three times a day (TID) | INTRAMUSCULAR | Status: DC
  Administered 2019-10-31 – 2019-11-01 (×4): 4 mg via INTRAVENOUS
  Filled 2019-10-31 (×4): qty 1

## 2019-10-31 MED ORDER — SODIUM ZIRCONIUM CYCLOSILICATE 10 G PO PACK
10.0000 g | PACK | Freq: Every day | ORAL | Status: DC
  Administered 2019-10-31 – 2019-11-01 (×2): 10 g via ORAL
  Filled 2019-10-31 (×2): qty 1

## 2019-10-31 MED ORDER — LEVETIRACETAM ER 500 MG PO TB24
500.0000 mg | ORAL_TABLET | Freq: Two times a day (BID) | ORAL | Status: DC
  Administered 2019-10-31 – 2019-11-01 (×3): 500 mg via ORAL
  Filled 2019-10-31 (×4): qty 1

## 2019-10-31 MED ORDER — MORPHINE SULFATE ER 15 MG PO TBCR
15.0000 mg | EXTENDED_RELEASE_TABLET | Freq: Two times a day (BID) | ORAL | Status: DC
  Administered 2019-10-31 – 2019-11-01 (×3): 15 mg via ORAL
  Filled 2019-10-31 (×3): qty 1

## 2019-10-31 NOTE — Consult Note (Signed)
[place] NOTE  Patient Care Team: Center, [facility] as PCP - General ([place]) Telford Nab, RN as Oncology Nurse Navigator  CHIEF COMPLAINTS/PURPOSE OF CONSULTATION: Lung cancer; brain metastases  HISTORY OF PRESENTING ILLNESS:  Donald Gibbs 85 y.o.  male patient with newly diagnosed stage IV lung cancer currently undergoing palliative radiation to the chest wall is continued to hospital for worsening confusion and generalized weakness feeling poorly/and gait instability.  On further work-up in the emergency room noted to have-on further imaging in the emergency room-overall stable lung mass.  However MRI of the brain showed cerebellar; and frontal lobe lesion.  Patient also noted to have blood pressures in 62B systolic; white count in 76,283 [chronic].  Patient this morning is currently unable to answer any questions meaningfully.  Eager to go home.   Review of Systems  Unable to perform ROS: Acuity of condition     MEDICAL HISTORY:  Past Medical History:  Diagnosis Date  . Cardiomyopathy    secondary. echo-EF 25-35%. Adensoine myoview- EF 31% w/a moderately dilated ventricle. there was thinning of the inferior wall, tissue attenuation v infarct, no ischemia.   Marland Kitchen COPD (chronic obstructive pulmonary disease) ([place])   . Decreased left ventricular function   . HTN (hypertension)     SURGICAL HISTORY: History reviewed. No pertinent surgical history.  SOCIAL HISTORY: Social History   Socioeconomic History  . Marital status: Married    Spouse name: Not on file  . Number of children: Not on file  . Years of education: Not on file  . Highest education level: Not on file  Occupational History  . Not on file  Tobacco Use  . Smoking status: Former Research scientist (life sciences)  . Smokeless tobacco: Never Used  Substance and Sexual Activity  . Alcohol use: Not Currently  . Drug use: Not Currently  . Sexual activity: Not on file  Other Topics  Concern  . Not on file  Social History Narrative   Lives in [place] with wife; quit smoking > 20 y; no alcohol; son-Ray lives 5-6 miles/in [place]. Worked in Air traffic controller; no asbestos exposure. Walks independently.    Social Determinants of Health   Financial Resource Strain:   . Difficulty of Paying Living Expenses:   Food Insecurity:   . Worried About Charity fundraiser in the Last Year:   . Arboriculturist in the Last Year:   Transportation Needs:   . Film/video editor (Medical):   Marland Kitchen Lack of Transportation (Non-Medical):   Physical Activity:   . Days of Exercise per Week:   . Minutes of Exercise per Session:   Stress:   . Feeling of Stress :   Social Connections:   . Frequency of Communication with Friends and Family:   . Frequency of Social Gatherings with Friends and Family:   . Attends Religious Services:   . Active Member of Clubs or Organizations:   . Attends Archivist Meetings:   Marland Kitchen Marital Status:   Intimate Partner Violence:   . Fear of Current or Ex-Partner:   . Emotionally Abused:   Marland Kitchen Physically Abused:   . Sexually Abused:     FAMILY HISTORY: Family History  Problem Relation Age of Onset  . Heart attack Brother     ALLERGIES:  has No Known Allergies.  MEDICATIONS:  Current Facility-Administered Medications  Medication Dose Route Frequency Provider Last Rate Last Admin  . 0.9 %  sodium chloride infusion   Intravenous  Continuous [name], MD 50 mL/hr at 10/31/19 0216 New Bag at 10/31/19 0216  . acetaminophen (TYLENOL) tablet 650 mg  650 mg Oral Q6H PRN [name], MD       Or  . acetaminophen (TYLENOL) suppository 650 mg  650 mg Rectal Q6H PRN Gala Romney L, MD      . ceFEPIme (MAXIPIME) 2 g in sodium chloride 0.9 % 100 mL IVPB  2 g Intravenous Q24H Gala Romney L, MD 200 mL/hr at 10/31/19 1606 2 g at 10/31/19 1606  . dexamethasone (DECADRON) injection 4 mg  4 mg Intravenous Q8H Charlaine Dalton R, MD    4 mg at 10/31/19 1358  . heparin injection 5,000 Units  5,000 Units Subcutaneous Q8H [name], MD   5,000 Units at 10/31/19 1357  . HYDROcodone-acetaminophen (NORCO) 10-325 MG per tablet 1 tablet  1 tablet Oral Q6H PRN Fritzi Mandes, MD   1 tablet at 10/31/19 1156  . levETIRAcetam (KEPPRA XR) 24 hr tablet 500 mg  500 mg Oral BID Fritzi Mandes, MD   500 mg at 10/31/19 1156  . morphine (MS CONTIN) 12 hr tablet 15 mg  15 mg Oral Q12H Fritzi Mandes, MD   15 mg at 10/31/19 1358  . ondansetron (ZOFRAN) tablet 4 mg  4 mg Oral Q6H PRN [name], MD       Or  . ondansetron (ZOFRAN) injection 4 mg  4 mg Intravenous Q6H PRN Gala Romney L, MD      . sodium zirconium cyclosilicate (LOKELMA) packet 10 g  10 g Oral Daily Fritzi Mandes, MD   10 g at 10/31/19 1202      .  PHYSICAL EXAMINATION:  Vitals:   10/31/19 1233 10/31/19 1606  BP: 121/66 (!) 163/67  Pulse: (!) 115 84  Resp: 20 18  Temp:  (!) 97.5 F (36.4 C)  SpO2: 95% 96%   Filed Weights   10/30/19 1629  Weight: 113 lb (51.3 kg)    Physical Exam Constitutional:      Comments: Cachectic appearing Caucasian male patient.  He is alone.  HENT:     Head: Normocephalic and atraumatic.     Mouth/Throat:     Pharynx: No oropharyngeal exudate.  Eyes:     Pupils: Pupils are equal, round, and reactive to light.  Cardiovascular:     Rate and Rhythm: Normal rate and regular rhythm.  Pulmonary:     Effort: No respiratory distress.     Breath sounds: No wheezing.     Comments: Decreased air entry bilaterally. Abdominal:     General: Bowel sounds are normal. There is no distension.     Palpations: Abdomen is soft. There is no mass.     Tenderness: There is no abdominal tenderness. There is no guarding or rebound.  Musculoskeletal:        General: No tenderness. Normal range of motion.     Cervical back: Normal range of motion and neck supple.  Skin:    General: Skin is warm.  Neurological:     Mental Status: He is alert. He  is disoriented.  Psychiatric:        Mood and Affect: Affect normal.      LABORATORY DATA:  I have reviewed the data as listed Lab Results  Component Value Date   WBC 33.9 (H) 10/31/2019   HGB 10.8 (L) 10/31/2019   HCT 31.8 (L) 10/31/2019   MCV 86.6 10/31/2019   PLT 278 10/31/2019  Recent Labs    10/24/19 1031 10/30/19 1642 10/31/19 0936  NA 133* 133* 134*  K 5.3* 5.3* 5.3*  CL 96* 96* 102  CO2 26 22 21*  GLUCOSE 159* 121* 113*  BUN 36* 73* 61*  CREATININE 1.14 1.97* 1.43*  CALCIUM 8.3* 7.9* 7.5*  GFRNONAA 59* 31* 45*  GFRAA >60 35* 52*  PROT 6.4* 5.7* 5.7*  ALBUMIN 2.8* 2.3* 2.3*  AST 29 31 32  ALT 17 16 15   ALKPHOS 64 76 78  BILITOT 0.7 1.0 0.8  BILIDIR  --  0.3*  --   IBILI  --  0.7  --     RADIOGRAPHIC STUDIES: I have personally reviewed the radiological images as listed and agreed with the findings in the report. CT ABDOMEN PELVIS WO CONTRAST  Result Date: 10/30/2019 CLINICAL DATA:  Non-small cell right lung cancer, hypotension, abdominal pain, fever, multiple falls EXAM: CT CHEST, ABDOMEN AND PELVIS WITHOUT CONTRAST TECHNIQUE: Multidetector CT imaging of the chest, abdomen and pelvis was performed following the standard protocol without IV contrast. COMPARISON:  10/22/2019 FINDINGS: CT CHEST FINDINGS Cardiovascular: Heart is stable without pericardial effusion. Extensive atherosclerosis of the aorta and coronary vessels. Mediastinum/Nodes: No enlarged mediastinal, hilar, or axillary lymph nodes. Thyroid gland, trachea, and esophagus demonstrate no significant findings. Lungs/Pleura: The large infiltrative mass centered in the right upper lobe is again identified, with extension through the right chest wall and destruction of the right right second through fifth ribs. No significant change since prior study. Stable small right pleural effusion. Diffuse background emphysema. No pneumothorax. Musculoskeletal: There is bony destruction of the right second through  fifth ribs related to the invasive right upper lobe mass. There are no acute displaced fractures. Reconstructed images demonstrate no additional findings. CT ABDOMEN PELVIS FINDINGS Hepatobiliary: Marked distension of the gallbladder without cholelithiasis or cholecystitis. No focal liver abnormalities on this unenhanced exam. Pancreas: Marked pancreatic atrophy with no focal parenchymal abnormalities. Spleen: Normal in size without focal abnormality. Adrenals/Urinary Tract: Stable left renal cyst. No urinary tract calculi or obstructive uropathy within either kidney. The bladder is unremarkable. Bilateral adrenal masses are compatible with known metastatic disease, stable. Stomach/Bowel: No bowel obstruction or ileus. Diffuse diverticulosis of the distal colon without diverticulitis. There is marked fecal retention. No bowel wall thickening or inflammatory change. Vascular/Lymphatic: Large lymph node mass at the porta hepatis measures 7.1 x 5.5 cm, unchanged since recent PET scan. Metastatic deposit in the retroperitoneum lateral to the left kidney unchanged since recent PET scan, measuring up to 2.7 cm. Diffuse atherosclerosis unchanged. Reproductive: Prostate is unremarkable. Other: No free fluid or free gas. No abdominal wall hernia. Musculoskeletal: No acute or destructive bony lesions. IMPRESSION: 1. No significant interval change in the large infiltrative mass centered in the right upper lobe, with extension through the right chest wall and destruction of the right second through fifth ribs. 2. Stable small right pleural effusion. 3. Stable metastatic disease involving porta hepatis adenopathy, bilateral adrenal glands, and retroperitoneum lateral to the left kidney. 4. Marked fecal retention. No obstruction or ileus. 5. Aortic Atherosclerosis (ICD10-I70.0) and Emphysema (ICD10-J43.9). Electronically Signed   By: Randa Ngo M.D.   On: 10/30/2019 18:52   DG Chest 2 View  Result Date:  10/04/2019 CLINICAL DATA:  Shortness of breath EXAM: CHEST - 2 VIEW COMPARISON:  06/12/2006 FINDINGS: Cardiac shadow is within normal limits. Left lung is hyperinflated but clear. There is a pleural based mass lesion identified along the lateral chest wall on the right which  measures 9.5 cm in greatest dimension. There are destructive changes involving the second, third and fourth ribs consistent with local invasion. Associated parenchymal density is noted likely related to lymphangitic spread in the upper lobe. No sizable effusion is seen. IMPRESSION: Pleural based mass lesion consistent with pulmonary neoplasm with erosive changes involving the second, third and fourth ribs as well as likely lymphangitic spread within the right upper lobe. CT of the chest is recommended for further evaluation. Electronically Signed   By: Inez Catalina M.D.   On: 10/04/2019 11:45   DG Wrist Complete Left  Result Date: 10/30/2019 CLINICAL DATA:  Several falls with wrist pain EXAM: LEFT WRIST - COMPLETE 3+ VIEW COMPARISON:  PET CT 10/22/2019 FINDINGS: No acute displaced fracture is visualized. Large 6 cm lytic lesion within the distal ulna with associated soft tissue mass. IMPRESSION: No acute displaced fracture. Large lytic/bony destructive lesion involving the distal ulna with associated soft tissue mass either representing metastatic disease or primary bone tumor. Electronically Signed   By: Donavan Foil M.D.   On: 10/30/2019 18:15   CT Head Wo Contrast  Result Date: 10/30/2019 CLINICAL DATA:  Multiple falls history of lung cancer EXAM: CT HEAD WITHOUT CONTRAST TECHNIQUE: Contiguous axial images were obtained from the base of the skull through the vertex without intravenous contrast. COMPARISON:  MRI 06/18/2006, CT brain 06/16/2006, PET CT 10/22/2019 FINDINGS: Brain: No acute territorial infarction is visualized. Chronic left cerebellar infarct. Suspected hypodense mass measuring 1.6 cm in the right cerebellum, series 2  image number 7. Hyperdense right frontal lobe lesion measuring 1.8 cm, series 2, image number 18. No midline shift. Extensive white matter hypodensity. Moderate atrophy. Prominent ventricles likely due to atrophy. Hypodensity within the inferior right frontal lobe white matter. Probable chronic lacunar infarcts within the bilateral basal ganglia. Vascular: No hyperdense vessels. Vertebral and carotid vascular calcification. Skull: Normal. Negative for fracture or focal lesion. Sinuses/Orbits: No acute finding. Other: None IMPRESSION: 1. 1.8 cm right frontal lobe hyperdense mass, suspicious for metastatic disease, potentially hemorrhagic metastatic focus. Additional suspected 1.6 cm right cerebellar mass. Negative for midline shift. 2. Extensive hypodensity within the bilateral white matter, much of which is suspected to be secondary to chronic small vessel ischemic change. More focal hypodensity within the inferior right frontal lobe white matter could reflect edema related to metastatic disease versus small chronic infarct. There is atrophy. 3. Chronic left cerebellar infarct Electronically Signed   By: Donavan Foil M.D.   On: 10/30/2019 18:51   CT Chest Wo Contrast  Result Date: 10/30/2019 CLINICAL DATA:  Non-small cell right lung cancer, hypotension, abdominal pain, fever, multiple falls EXAM: CT CHEST, ABDOMEN AND PELVIS WITHOUT CONTRAST TECHNIQUE: Multidetector CT imaging of the chest, abdomen and pelvis was performed following the standard protocol without IV contrast. COMPARISON:  10/22/2019 FINDINGS: CT CHEST FINDINGS Cardiovascular: Heart is stable without pericardial effusion. Extensive atherosclerosis of the aorta and coronary vessels. Mediastinum/Nodes: No enlarged mediastinal, hilar, or axillary lymph nodes. Thyroid gland, trachea, and esophagus demonstrate no significant findings. Lungs/Pleura: The large infiltrative mass centered in the right upper lobe is again identified, with extension through  the right chest wall and destruction of the right right second through fifth ribs. No significant change since prior study. Stable small right pleural effusion. Diffuse background emphysema. No pneumothorax. Musculoskeletal: There is bony destruction of the right second through fifth ribs related to the invasive right upper lobe mass. There are no acute displaced fractures. Reconstructed images demonstrate no additional findings. CT ABDOMEN  PELVIS FINDINGS Hepatobiliary: Marked distension of the gallbladder without cholelithiasis or cholecystitis. No focal liver abnormalities on this unenhanced exam. Pancreas: Marked pancreatic atrophy with no focal parenchymal abnormalities. Spleen: Normal in size without focal abnormality. Adrenals/Urinary Tract: Stable left renal cyst. No urinary tract calculi or obstructive uropathy within either kidney. The bladder is unremarkable. Bilateral adrenal masses are compatible with known metastatic disease, stable. Stomach/Bowel: No bowel obstruction or ileus. Diffuse diverticulosis of the distal colon without diverticulitis. There is marked fecal retention. No bowel wall thickening or inflammatory change. Vascular/Lymphatic: Large lymph node mass at the porta hepatis measures 7.1 x 5.5 cm, unchanged since recent PET scan. Metastatic deposit in the retroperitoneum lateral to the left kidney unchanged since recent PET scan, measuring up to 2.7 cm. Diffuse atherosclerosis unchanged. Reproductive: Prostate is unremarkable. Other: No free fluid or free gas. No abdominal wall hernia. Musculoskeletal: No acute or destructive bony lesions. IMPRESSION: 1. No significant interval change in the large infiltrative mass centered in the right upper lobe, with extension through the right chest wall and destruction of the right second through fifth ribs. 2. Stable small right pleural effusion. 3. Stable metastatic disease involving porta hepatis adenopathy, bilateral adrenal glands, and  retroperitoneum lateral to the left kidney. 4. Marked fecal retention. No obstruction or ileus. 5. Aortic Atherosclerosis (ICD10-I70.0) and Emphysema (ICD10-J43.9). Electronically Signed   By: Randa Ngo M.D.   On: 10/30/2019 18:52   CT Angio Chest PE W and/or Wo Contrast  Result Date: 10/04/2019 CLINICAL DATA:  Pleural mass with bony destruction on recent chest x-ray, shortness of breath EXAM: CT ANGIOGRAPHY CHEST WITH CONTRAST TECHNIQUE: Multidetector CT imaging of the chest was performed using the standard protocol during bolus administration of intravenous contrast. Multiplanar CT image reconstructions and MIPs were obtained to evaluate the vascular anatomy. CONTRAST:  47mL OMNIPAQUE IOHEXOL 350 MG/ML SOLN COMPARISON:  Chest x-ray from earlier in the same day FINDINGS: Cardiovascular: Thoracic aorta and its branches demonstrate atherosclerotic calcifications. No aneurysm or dissection is seen. No cardiac enlargement is noted. Coronary calcifications are noted. The pulmonary artery shows a normal branching pattern without intraluminal filling defect to suggest pulmonary embolism. Mediastinum/Nodes: Thoracic inlet is within normal limits. No sizable hilar or mediastinal adenopathy is noted. The esophagus as visualized is within normal limits. Lungs/Pleura: Left lung demonstrates emphysematous change and apical blebs. No focal infiltrate or effusion is seen. Small 3 mm nodule is noted in the lateral aspect of the left lower lobe best seen on image number 104 of series 6. No other definitive nodules are seen. In the right upper lobe, there is a pleural base mass lesion with chest wall invasion which measures at least 12.8 x 9.0 cm. There are erosive changes of the second third and fourth ribs related to the underlying mass lesion. Additionally there are invasion into the subscapularis rib region. Diffuse lymphangitic spread of neoplasm is noted throughout the right upper lobe similar to that seen on prior  plain film examination. Calcified granuloma is noted in the right middle lobe. Large right-sided pleural effusion is seen. Upper Abdomen: Large cystic lesion is noted in the left kidney measuring 5 cm most consistent with a simple cyst. Smaller cysts are noted within the upper pole of the left kidney. Nonobstructing 4 mm stone is noted in the upper pole of the right kidney. Spleen is within normal limits. The liver and gallbladder are unremarkable. Focal soft tissue mass lesion is noted which appears to arise from the head of the pancreas. It  envelops the right hepatic artery and measures approximately 6.9 x 5.1 cm. Bilateral adrenal lesions are noted suspicious for metastatic disease. Largest of these lies on the right measuring 3.1 cm. Musculoskeletal: Resorption of the second third and fourth ribs on the right is seen related to the underlying pleural based mass lesion. An additional pleural based lesion involving the right chest wall is noted anteriorly best seen on image number 231 of series 5. This measures approximately 2.8 cm and causes some destruction of the anterior aspect of the right fifth rib. No other bony destructive lesions are seen. Review of the MIP images confirms the above findings. IMPRESSION: Large pleural base mass with right chest wall invasion and resorption of the second third fourth and fifth ribs in varying amounts. Associated right-sided pleural effusion is noted as well as significant lymphangitic spread of neoplasm in the right upper lobe. PET-CT in further workup is recommended as clinically indicated. Bilateral adrenal lesions are noted suspicious for metastatic disease. There is a soft tissue mass lesion which appears to arise from the head of the pancreas superiorly. This may represent metastatic disease although the possibility of a pancreatic primary deserves consideration. No evidence of pulmonary emboli. Aortic Atherosclerosis (ICD10-I70.0) and Emphysema (ICD10-J43.9).  Electronically Signed   By: Inez Catalina M.D.   On: 10/04/2019 13:01   CT Cervical Spine Wo Contrast  Result Date: 10/30/2019 CLINICAL DATA:  Trauma multiple fall history of lung cancer EXAM: CT CERVICAL SPINE WITHOUT CONTRAST TECHNIQUE: Multidetector CT imaging of the cervical spine was performed without intravenous contrast. Multiplanar CT image reconstructions were also generated. COMPARISON:  10/30/2019 radiographs FINDINGS: Alignment: 2 mm anterolisthesis C6 on C7. Generalized straightening of the cervical spine. Skull base and vertebrae: No acute fracture. No primary bone lesion or focal pathologic process. Soft tissues and spinal canal: No prevertebral fluid or swelling. No visible canal hematoma. Disc levels: Moderate diffuse degenerative changes throughout the cervical spine with multiple level disc space narrowing and osteophytes. Partial ankylosis at C2-C3, C5-C6. Bridging osteophyte C5 through C7. Mild chronic appearing superior endplate deformity at T1. Facet degenerative changes at multiple levels. Multiple level bilateral foraminal narrowing. Upper chest: Incompletely visualized right upper chest wall and lung mass with right second and third rib destruction. Other: None IMPRESSION: 1. No acute fracture identified. 2 mm anterolisthesis C6 on C7 likely degenerative 2. Incompletely visualized right upper chest wall mass with bony destruction of second and third ribs. Electronically Signed   By: Donavan Foil M.D.   On: 10/30/2019 19:00   MR Brain W and Wo Contrast  Result Date: 10/30/2019 CLINICAL DATA:  History of lung cancer.  Falling.  Abnormal head CT. EXAM: MRI HEAD WITHOUT AND WITH CONTRAST TECHNIQUE: Multiplanar, multiecho pulse sequences of the brain and surrounding structures were obtained without and with intravenous contrast. CONTRAST:  24mL GADAVIST GADOBUTROL 1 MMOL/ML IV SOLN COMPARISON:  CT earlier same day.  MRI 06/18/2006 FINDINGS: The study suffers from motion degradation.  Brain: There is old infarction affecting the inferior cerebellum more extensive on the left than the right. Old small vessel infarctions are present throughout the pons. Old small vessel infarctions affect the thalami. Old small vessel ischemic change throughout the cerebral hemispheric white matter. 2 cm in diameter metastasis within the right cerebellum. Some internal hemorrhage. Surrounding vasogenic edema. 1.7 cm metastasis in the right posterior parietal lobe. Small amount of internal hemorrhage. Associated vasogenic edema. 1.8 cm in diameter metastasis at the right frontoparietal junction at the vertex. Some internal hemorrhage.  Surrounding vasogenic edema. No midline shift.  No hydrocephalus.  No extra-axial collection. Vascular: Major vessels at the base of the brain show flow. Skull and upper cervical spine: Negative Sinuses/Orbits: Clear/normal Other: None IMPRESSION: 2 cm right cerebellar metastasis. 1.7 cm right posterior parietal metastasis. 1.8 cm right frontoparietal junction metastasis. All 3 of these are associated with petechial blood products and surrounding vasogenic edema. Old inferior cerebellar infarctions more extensive on the left than the right. Old brainstem infarctions and thalamic infarctions. Chronic small-vessel ischemic changes throughout the white matter. Electronically Signed   By: Nelson Chimes M.D.   On: 10/30/2019 21:54   NM PET Image Initial (PI) Skull Base To Thigh  Result Date: 10/22/2019 CLINICAL DATA:  Initial treatment strategy for biopsy proven non-small cell right lung cancer. EXAM: NUCLEAR MEDICINE PET SKULL BASE TO THIGH TECHNIQUE: 6.5 mCi F-18 FDG was injected intravenously. Full-ring PET imaging was performed from the skull base to thigh after the radiotracer. CT data was obtained and used for attenuation correction and anatomic localization. Fasting blood glucose: 90 mg/dl COMPARISON:  10/04/2019 chest CT angiogram. FINDINGS: Mediastinal blood pool activity: SUV  max 2.1 Liver activity: SUV max NA NECK: No hypermetabolic lymph nodes in the neck. Incidental CT findings: Left cerebellar hemisphere encephalomalacia. Mucoperiosteal thickening in the right maxillary sinus. CHEST: Intensely hypermetabolic chest wall invasive peripheral right upper lobe 14.1 x 11.2 cm lung mass with max SUV 22.9 (series 3/image 79) with lytic destruction of large portions of the right second through fifth ribs. No enlarged or hypermetabolic axillary, mediastinal or hilar lymph nodes. Incidental CT findings: Coronary atherosclerosis. Atherosclerotic nonaneurysmal thoracic aorta. Severe centrilobular and paraseptal emphysema. Subcentimeter calcified right middle lobe granuloma. Patchy regions of septal thickening and ground-glass opacity in the right upper lobe surrounding large lung mass. Tiny 3 mm peripheral left lower lobe pulmonary nodule (series 3/image 146), below PET resolution. ABDOMEN/PELVIS: Hypermetabolic 4.1 cm right adrenal mass with max SUV 8.9 (series 3/image 154). Hypermetabolic 2.2 cm left adrenal mass with max SUV 6.5 (series 3/image 162). Hypermetabolic 2.6 x 2.2 cm left retroperitoneal soft tissue mass lateral to the upper left kidney with max SUV 6.1 (series 3/image 166). Bulky hypermetabolic 5.0 cm short axis diameter porta hepatis lymph node with max SUV 8.5 (series 3/image 155). No abnormal hypermetabolic activity within the liver, pancreas or spleen. No hypermetabolic lymph nodes in the pelvis. Incidental CT findings: Simple 4.9 cm anterior interpolar left renal cyst. Atherosclerotic abdominal aorta with 2.5 cm ectatic infrarenal abdominal aorta. Marked sigmoid diverticulosis. SKELETON: Expansile hypermetabolic lytic anterior right fifth rib metastasis with max SUV 8.4 (series 3/image 123). No additional hypermetabolic skeletal lesions. Incidental CT findings: none IMPRESSION: 1. Intensely hypermetabolic chest wall invasive peripheral right upper lobe 14.1 cm lung mass with  lytic destruction of portions of the right second through fifth ribs, compatible with locally advanced primary bronchogenic carcinoma. 2. Hypermetabolic bilateral adrenal metastases. 3. Hypermetabolic bulky porta hepatis nodal metastasis. 4. Hypermetabolic left retroperitoneal soft tissue metastasis lateral to the upper left kidney. 5. Hypermetabolic expansile lytic anterior right fifth rib metastasis. 6. Tiny 3 mm peripheral left lower lobe pulmonary nodule, below PET resolution. 7. Infrarenal ectatic 2.5 cm abdominal aorta, at risk for aneurysm development. Recommend follow-up aortic ultrasound in 5 years. This recommendation follows ACR consensus guidelines: White Paper of the ACR Incidental Findings Committee II on Vascular Findings. J Am Coll Radiol 2013; 10:789-794. 8. Aortic Atherosclerosis (ICD10-I70.0) and Emphysema (ICD10-J43.9). Additional chronic findings as detailed. Electronically Signed   By: Rinaldo Ratel  Poff M.D.   On: 10/22/2019 15:35   DG Chest Portable 1 View  Result Date: 10/30/2019 CLINICAL DATA:  Sepsis EXAM: PORTABLE CHEST 1 VIEW COMPARISON:  Radiograph 10/06/2019 FINDINGS: Redemonstration of the pleural base mass with associated destructive changes of the adjacent ribs and chest wall involvement as well as thickened reticular opacities in the right upper lung suggestive of lymphangitic spread. Suspect a small right pleural effusion much of which is likely sub pulmonic in nature. Adjacent areas of passive atelectasis are present. Underlying infection in the right upper lung or atelectatic right lung base is difficult to exclude. Left lung remains largely clear aside from some emphysematous changes. The aorta is calcified. The remaining cardiomediastinal contours are unremarkable. No other acute or suspicious osseous lesions are identified. Degenerative changes are present in the imaged spine and shoulders. IMPRESSION: 1. Redemonstrated right pleural mass involving the right upper lobe with  associated destructive changes of the adjacent ribs and chest wall involvement as well as thickened reticular opacities in the right upper lung suggestive of lymphangitic spread. Appearance is slightly progressed from comparison with increasing coalescent opacity. 2. Suspect a small right pleural effusion much of which is likely subpulmonic in nature. 3. Presence of these findings may limit detection of underlying infection. Electronically Signed   By: Lovena Le M.D.   On: 10/30/2019 17:36   DG Chest Port 1 View  Result Date: 10/06/2019 CLINICAL DATA:  Status post chest wall biopsy EXAM: PORTABLE CHEST 1 VIEW COMPARISON:  10/04/2019 FINDINGS: Cardiac shadow is stable. Aortic calcifications are seen. Left lung remains clear. Right lung again demonstrates pleural based mass with changes of lymphangitic spread throughout the upper lobe. Bony destruction in the rib cage is seen. No pneumothorax is seen. No new focal abnormality is noted. IMPRESSION: Stable appearance of the chest when compared with the prior exam. No pneumothorax is identified. Electronically Signed   By: Inez Catalina M.D.   On: 10/06/2019 15:02   [place] CORE BIOPSY (SOFT TISSUE)  Result Date: 10/06/2019 INDICATION: Known right upper lobe mass lesion with chest wall invasion and bony destruction EXAM: ULTRASOUND-GUIDED RIGHT POSTERIOR CHEST WALL BIOPSY MEDICATIONS: None. ANESTHESIA/SEDATION: None FLUOROSCOPY TIME:  Not applicable COMPLICATIONS: None immediate. PROCEDURE: Informed written consent was obtained from the patient after a thorough discussion of the procedural risks, benefits and alternatives. All questions were addressed. Maximal Sterile Barrier Technique was utilized including caps, mask, sterile gowns, sterile gloves, sterile drape, hand hygiene and skin antiseptic. A timeout was performed prior to the initiation of the procedure. Utilizing 1% xylocaine as local anesthetic and real-time ultrasound guidance a 17 gauge guiding needle  was placed percutaneously into posterior chest wall mass invading through the posterior ribcage on the right. Multiple 18 gauge core biopsies were then obtained. The needle was then removed and a pressure dressing placed to aid in hemostasis. Patient tolerated the procedure well and was returned his room in satisfactory condition. IMPRESSION: Successful ultrasound-guided biopsy of a right posterior chest wall mass invading through the ribcage from the upper lobe. Electronically Signed   By: Inez Catalina M.D.   On: 10/06/2019 13:59    Malignant neoplasm of upper lobe of right lung [facility]) #85 year old male patient with stage IV lung cancer-is currently in the hospital for generalized weakness/gait abnormalities-noted to have hypotension/brain metastases.  # Stage IV lung cancer-adenosquamous; right chest wall mass; right upper lobe mass; bilateral adrenal metastases; pancreatic mass; currently undergoing chest wall radiation.  #Hypotension/sepsis-question postobstructive pneumonia.  #Ischemic cardiomyopathy-[30 to 35% ejection  fraction]-  #Acute delirium-underlying infection/metastatic malignancy of the brain.  #DNR/DNI  #Recommendations:  #Unfortunately patient has poor prognosis given his stage IV lung cancer.  Patient unfortunately has poor performance status.  I discussed with the patient son Ray-regarding incurable nature of his disease; and his performance status is prohibitive of any further therapies.  Patient will have poor results with radiation and systemic therapy.  I would recommend hospice.  Patient family is interested.  Interested in taking the patient home with hospice.  Thank you Dr.Patel for allowing me to participate in the care of your pleasant patient. Please do not hesitate to contact me with questions or concerns in the interim.  Discussed with Dr. Posey Pronto.  All questions were answered. The patient knows to call the clinic with any problems, questions or concerns.  # I  reviewed the blood work- with the patient in detail; also reviewed the imaging independently [as summarized above]; and with the patient in detail.      Cammie Sickle, MD 10/31/2019 4:29 PM

## 2019-10-31 NOTE — Assessment & Plan Note (Signed)
#  85 year old male patient with stage IV lung cancer-is currently in the hospital for generalized weakness/gait abnormalities-noted to have hypotension/brain metastases.  # Stage IV lung cancer-adenosquamous; right chest wall mass; right upper lobe mass; bilateral adrenal metastases; pancreatic mass; currently undergoing chest wall radiation.  #Hypotension/sepsis-question postobstructive pneumonia.  #Ischemic cardiomyopathy-[30 to 35% ejection fraction]-  #Acute delirium-underlying infection/metastatic malignancy of the brain.  #DNR/DNI  #Recommendations:  #Unfortunately patient has poor prognosis given his stage IV lung cancer.  Patient unfortunately has poor performance status.  I discussed with the patient son Ray-regarding incurable nature of his disease; and his performance status is prohibitive of any further therapies.  Patient will have poor results with radiation and systemic therapy.  I would recommend hospice.  Patient family is interested.  Interested in taking the patient home with hospice.  Thank you Dr.Patel for allowing me to participate in the care of your pleasant patient. Please do not hesitate to contact me with questions or concerns in the interim.  Discussed with Dr. Posey Pronto.

## 2019-10-31 NOTE — TOC Progression Note (Signed)
Transition of Care [facility]) - Progression Note    Patient Details  Name: Donald Gibbs MRN: [id number] Date of Birth: [birth year]  Transition of Care [facility]) CM/SW Contact  Takeshi Teasdale, Gardiner Rhyme, LCSW Phone Number: 10/31/2019, 11:04 AM  Clinical Narrative:  Spoke with MD who reports spoke with son-Ray who is also POA. The family wants hospice now. Have contacted him also and he and the rest of the family and pt's wife want hospice and prefer [facility]. MD has ordered hospice and this worker contacted [facility] to give referral. Son reports no equipment needs and would like to talk with someone due to has questions. [facility] will reach out to son today.         Expected Discharge Plan and Services                                                 Social Determinants of Health (SDOH) Interventions    Readmission Risk Interventions No flowsheet data found.

## 2019-10-31 NOTE — Progress Notes (Signed)
[facility] [place] NAME: Donald Gibbs    MR#:  [id number]  [place]:  1934-07-27  SUBJECTIVE:  patient lying in bed comfortably. Daughter Butch Penny in the room. Patient is asking for to eat bananas and peaches. He does not eat regular food per daughter. Denies any complaints. Feels little cold.  REVIEW OF SYSTEMS:   Review of Systems  Unable to perform ROS: Mental acuity   Tolerating Diet:little Tolerating PT:   DRUG ALLERGIES:  No Known Allergies  VITALS:  Blood pressure 121/66, pulse (!) 115, temperature 97.9 F (36.6 C), resp. rate 20, height 5\' 9"  (1.753 m), weight 51.3 kg, SpO2 95 %.  PHYSICAL EXAMINATION:   Physical Exam  GENERAL:  85 y.o.-year-old patient lying in the bed with no acute distress. friale  HEENT: Head atraumatic, normocephalic. Oropharynx and nasopharynx clear.  NECK:  Supple, no jugular venous distention. No thyroid enlargement, no tenderness.  LUNGS: decreasedbreath sounds bilaterally, no wheezing, rales, rhonchi. No use of accessory muscles of respiration.  CARDIOVASCULAR: S1, S2 normal. No murmurs, rubs, or gallops.  ABDOMEN: Soft, nontender, nondistended. Bowel sounds present. No organomegaly or mass.  EXTREMITIES: No cyanosis, clubbing or edema b/l.    PSYCHIATRIC:  patient is alert and oriented x2 SKIN: No obvious rash, lesion, or ulcer.   LABORATORY PANEL:  CBC Recent Labs  Lab 10/31/19 0936  WBC 33.9*  HGB 10.8*  HCT 31.8*  PLT 278    Chemistries  Recent Labs  Lab 10/31/19 0936  NA 134*  K 5.3*  CL 102  CO2 21*  GLUCOSE 113*  BUN 61*  CREATININE 1.43*  CALCIUM 7.5*  AST 32  ALT 15  ALKPHOS 78  BILITOT 0.8   Cardiac Enzymes No results for input(s): TROPONINI in the last 168 hours. RADIOLOGY:  CT ABDOMEN PELVIS WO CONTRAST  Result Date: 10/30/2019 CLINICAL DATA:  Non-small cell right lung cancer, hypotension, abdominal pain, fever, multiple falls EXAM: CT CHEST, ABDOMEN  AND PELVIS WITHOUT CONTRAST TECHNIQUE: Multidetector CT imaging of the chest, abdomen and pelvis was performed following the standard protocol without IV contrast. COMPARISON:  10/22/2019 FINDINGS: CT CHEST FINDINGS Cardiovascular: Heart is stable without pericardial effusion. Extensive atherosclerosis of the aorta and coronary vessels. Mediastinum/Nodes: No enlarged mediastinal, hilar, or axillary lymph nodes. Thyroid gland, trachea, and esophagus demonstrate no significant findings. Lungs/Pleura: The large infiltrative mass centered in the right upper lobe is again identified, with extension through the right chest wall and destruction of the right right second through fifth ribs. No significant change since prior study. Stable small right pleural effusion. Diffuse background emphysema. No pneumothorax. Musculoskeletal: There is bony destruction of the right second through fifth ribs related to the invasive right upper lobe mass. There are no acute displaced fractures. Reconstructed images demonstrate no additional findings. CT ABDOMEN PELVIS FINDINGS Hepatobiliary: Marked distension of the gallbladder without cholelithiasis or cholecystitis. No focal liver abnormalities on this unenhanced exam. Pancreas: Marked pancreatic atrophy with no focal parenchymal abnormalities. Spleen: Normal in size without focal abnormality. Adrenals/Urinary Tract: Stable left renal cyst. No urinary tract calculi or obstructive uropathy within either kidney. The bladder is unremarkable. Bilateral adrenal masses are compatible with known metastatic disease, stable. Stomach/Bowel: No bowel obstruction or ileus. Diffuse diverticulosis of the distal colon without diverticulitis. There is marked fecal retention. No bowel wall thickening or inflammatory change. Vascular/Lymphatic: Large lymph node mass at the porta hepatis measures 7.1 x 5.5 cm, unchanged since recent PET scan. Metastatic  deposit in the retroperitoneum lateral to the left  kidney unchanged since recent PET scan, measuring up to 2.7 cm. Diffuse atherosclerosis unchanged. Reproductive: Prostate is unremarkable. Other: No free fluid or free gas. No abdominal wall hernia. Musculoskeletal: No acute or destructive bony lesions. IMPRESSION: 1. No significant interval change in the large infiltrative mass centered in the right upper lobe, with extension through the right chest wall and destruction of the right second through fifth ribs. 2. Stable small right pleural effusion. 3. Stable metastatic disease involving porta hepatis adenopathy, bilateral adrenal glands, and retroperitoneum lateral to the left kidney. 4. Marked fecal retention. No obstruction or ileus. 5. Aortic Atherosclerosis (ICD10-I70.0) and Emphysema (ICD10-J43.9). Electronically Signed   By: Randa Ngo M.D.   On: 10/30/2019 18:52   DG Wrist Complete Left  Result Date: 10/30/2019 CLINICAL DATA:  Several falls with wrist pain EXAM: LEFT WRIST - COMPLETE 3+ VIEW COMPARISON:  PET CT 10/22/2019 FINDINGS: No acute displaced fracture is visualized. Large 6 cm lytic lesion within the distal ulna with associated soft tissue mass. IMPRESSION: No acute displaced fracture. Large lytic/bony destructive lesion involving the distal ulna with associated soft tissue mass either representing metastatic disease or primary bone tumor. Electronically Signed   By: Donavan Foil M.D.   On: 10/30/2019 18:15   CT Head Wo Contrast  Result Date: 10/30/2019 CLINICAL DATA:  Multiple falls history of lung cancer EXAM: CT HEAD WITHOUT CONTRAST TECHNIQUE: Contiguous axial images were obtained from the base of the skull through the vertex without intravenous contrast. COMPARISON:  MRI 06/18/2006, CT brain 06/16/2006, PET CT 10/22/2019 FINDINGS: Brain: No acute territorial infarction is visualized. Chronic left cerebellar infarct. Suspected hypodense mass measuring 1.6 cm in the right cerebellum, series 2 image number 7. Hyperdense right frontal  lobe lesion measuring 1.8 cm, series 2, image number 18. No midline shift. Extensive white matter hypodensity. Moderate atrophy. Prominent ventricles likely due to atrophy. Hypodensity within the inferior right frontal lobe white matter. Probable chronic lacunar infarcts within the bilateral basal ganglia. Vascular: No hyperdense vessels. Vertebral and carotid vascular calcification. Skull: Normal. Negative for fracture or focal lesion. Sinuses/Orbits: No acute finding. Other: None IMPRESSION: 1. 1.8 cm right frontal lobe hyperdense mass, suspicious for metastatic disease, potentially hemorrhagic metastatic focus. Additional suspected 1.6 cm right cerebellar mass. Negative for midline shift. 2. Extensive hypodensity within the bilateral white matter, much of which is suspected to be secondary to chronic small vessel ischemic change. More focal hypodensity within the inferior right frontal lobe white matter could reflect edema related to metastatic disease versus small chronic infarct. There is atrophy. 3. Chronic left cerebellar infarct Electronically Signed   By: Donavan Foil M.D.   On: 10/30/2019 18:51   CT Chest Wo Contrast  Result Date: 10/30/2019 CLINICAL DATA:  Non-small cell right lung cancer, hypotension, abdominal pain, fever, multiple falls EXAM: CT CHEST, ABDOMEN AND PELVIS WITHOUT CONTRAST TECHNIQUE: Multidetector CT imaging of the chest, abdomen and pelvis was performed following the standard protocol without IV contrast. COMPARISON:  10/22/2019 FINDINGS: CT CHEST FINDINGS Cardiovascular: Heart is stable without pericardial effusion. Extensive atherosclerosis of the aorta and coronary vessels. Mediastinum/Nodes: No enlarged mediastinal, hilar, or axillary lymph nodes. Thyroid gland, trachea, and esophagus demonstrate no significant findings. Lungs/Pleura: The large infiltrative mass centered in the right upper lobe is again identified, with extension through the right chest wall and destruction of  the right right second through fifth ribs. No significant change since prior study. Stable small right pleural effusion. Diffuse  background emphysema. No pneumothorax. Musculoskeletal: There is bony destruction of the right second through fifth ribs related to the invasive right upper lobe mass. There are no acute displaced fractures. Reconstructed images demonstrate no additional findings. CT ABDOMEN PELVIS FINDINGS Hepatobiliary: Marked distension of the gallbladder without cholelithiasis or cholecystitis. No focal liver abnormalities on this unenhanced exam. Pancreas: Marked pancreatic atrophy with no focal parenchymal abnormalities. Spleen: Normal in size without focal abnormality. Adrenals/Urinary Tract: Stable left renal cyst. No urinary tract calculi or obstructive uropathy within either kidney. The bladder is unremarkable. Bilateral adrenal masses are compatible with known metastatic disease, stable. Stomach/Bowel: No bowel obstruction or ileus. Diffuse diverticulosis of the distal colon without diverticulitis. There is marked fecal retention. No bowel wall thickening or inflammatory change. Vascular/Lymphatic: Large lymph node mass at the porta hepatis measures 7.1 x 5.5 cm, unchanged since recent PET scan. Metastatic deposit in the retroperitoneum lateral to the left kidney unchanged since recent PET scan, measuring up to 2.7 cm. Diffuse atherosclerosis unchanged. Reproductive: Prostate is unremarkable. Other: No free fluid or free gas. No abdominal wall hernia. Musculoskeletal: No acute or destructive bony lesions. IMPRESSION: 1. No significant interval change in the large infiltrative mass centered in the right upper lobe, with extension through the right chest wall and destruction of the right second through fifth ribs. 2. Stable small right pleural effusion. 3. Stable metastatic disease involving porta hepatis adenopathy, bilateral adrenal glands, and retroperitoneum lateral to the left kidney. 4.  Marked fecal retention. No obstruction or ileus. 5. Aortic Atherosclerosis (ICD10-I70.0) and Emphysema (ICD10-J43.9). Electronically Signed   By: Randa Ngo M.D.   On: 10/30/2019 18:52   CT Cervical Spine Wo Contrast  Result Date: 10/30/2019 CLINICAL DATA:  Trauma multiple fall history of lung cancer EXAM: CT CERVICAL SPINE WITHOUT CONTRAST TECHNIQUE: Multidetector CT imaging of the cervical spine was performed without intravenous contrast. Multiplanar CT image reconstructions were also generated. COMPARISON:  10/30/2019 radiographs FINDINGS: Alignment: 2 mm anterolisthesis C6 on C7. Generalized straightening of the cervical spine. Skull base and vertebrae: No acute fracture. No primary bone lesion or focal pathologic process. Soft tissues and spinal canal: No prevertebral fluid or swelling. No visible canal hematoma. Disc levels: Moderate diffuse degenerative changes throughout the cervical spine with multiple level disc space narrowing and osteophytes. Partial ankylosis at C2-C3, C5-C6. Bridging osteophyte C5 through C7. Mild chronic appearing superior endplate deformity at T1. Facet degenerative changes at multiple levels. Multiple level bilateral foraminal narrowing. Upper chest: Incompletely visualized right upper chest wall and lung mass with right second and third rib destruction. Other: None IMPRESSION: 1. No acute fracture identified. 2 mm anterolisthesis C6 on C7 likely degenerative 2. Incompletely visualized right upper chest wall mass with bony destruction of second and third ribs. Electronically Signed   By: Donavan Foil M.D.   On: 10/30/2019 19:00   MR Brain W and Wo Contrast  Result Date: 10/30/2019 CLINICAL DATA:  History of lung cancer.  Falling.  Abnormal head CT. EXAM: MRI HEAD WITHOUT AND WITH CONTRAST TECHNIQUE: Multiplanar, multiecho pulse sequences of the brain and surrounding structures were obtained without and with intravenous contrast. CONTRAST:  50mL GADAVIST GADOBUTROL 1  MMOL/ML IV SOLN COMPARISON:  CT earlier same day.  MRI 06/18/2006 FINDINGS: The study suffers from motion degradation. Brain: There is old infarction affecting the inferior cerebellum more extensive on the left than the right. Old small vessel infarctions are present throughout the pons. Old small vessel infarctions affect the thalami. Old small vessel ischemic change  throughout the cerebral hemispheric white matter. 2 cm in diameter metastasis within the right cerebellum. Some internal hemorrhage. Surrounding vasogenic edema. 1.7 cm metastasis in the right posterior parietal lobe. Small amount of internal hemorrhage. Associated vasogenic edema. 1.8 cm in diameter metastasis at the right frontoparietal junction at the vertex. Some internal hemorrhage. Surrounding vasogenic edema. No midline shift.  No hydrocephalus.  No extra-axial collection. Vascular: Major vessels at the base of the brain show flow. Skull and upper cervical spine: Negative Sinuses/Orbits: Clear/normal Other: None IMPRESSION: 2 cm right cerebellar metastasis. 1.7 cm right posterior parietal metastasis. 1.8 cm right frontoparietal junction metastasis. All 3 of these are associated with petechial blood products and surrounding vasogenic edema. Old inferior cerebellar infarctions more extensive on the left than the right. Old brainstem infarctions and thalamic infarctions. Chronic small-vessel ischemic changes throughout the white matter. Electronically Signed   By: Nelson Chimes M.D.   On: 10/30/2019 21:54   DG Chest Portable 1 View  Result Date: 10/30/2019 CLINICAL DATA:  Sepsis EXAM: PORTABLE CHEST 1 VIEW COMPARISON:  Radiograph 10/06/2019 FINDINGS: Redemonstration of the pleural base mass with associated destructive changes of the adjacent ribs and chest wall involvement as well as thickened reticular opacities in the right upper lung suggestive of lymphangitic spread. Suspect a small right pleural effusion much of which is likely sub pulmonic  in nature. Adjacent areas of passive atelectasis are present. Underlying infection in the right upper lung or atelectatic right lung base is difficult to exclude. Left lung remains largely clear aside from some emphysematous changes. The aorta is calcified. The remaining cardiomediastinal contours are unremarkable. No other acute or suspicious osseous lesions are identified. Degenerative changes are present in the imaged spine and shoulders. IMPRESSION: 1. Redemonstrated right pleural mass involving the right upper lobe with associated destructive changes of the adjacent ribs and chest wall involvement as well as thickened reticular opacities in the right upper lung suggestive of lymphangitic spread. Appearance is slightly progressed from comparison with increasing coalescent opacity. 2. Suspect a small right pleural effusion much of which is likely subpulmonic in nature. 3. Presence of these findings may limit detection of underlying infection. Electronically Signed   By: Lovena Le M.D.   On: 10/30/2019 17:36   ASSESSMENT AND PLAN:  Donald Gibbs is a 85 y.o. male with medical history significant of systolic dysfunction CHF with EF of 25 to 35%, stage IV lung cancer, right upper lung mass, pancreatic mass, history of radiation therapy, COPD, hypertension, who was brought in with significant hypotension weakness.  Patient is weak and debilitated. Temperature 94.1, blood pressure 70/40, pulse 94 respirate 22 on oxygen sats 92% room air.  White count 34.1  Sepsis with Acute renal failure -came in with hypothermia, hypotension, tachycardia, elevated lactate, elevated Pro calcitonin and chest x-ray with right large upper lobe lung mass with possible post-obstructive pneumonia.-Received IV fluid -blood pressure much improved -continue IV cefepime -M RSA PCR pending -blood cultures negative -UA and urine culture pending -lactic acid 2.0-- 1.8 -Pro calcitonin 2.6-- .9   #2 right lung mass with metal  stasis to brain and bones  -continue IV dexamethasone -PO Keppra for antiseizure prophylaxis  -seen by neurosurgery  -patient follows with Dr. Rogue Bussing. Spoke with son. Overall poor prognosis. Dr. B discussed with family and they are in agreement with home hospice. -Code status discussed with son Aldon Hengst-- DNR   #3 essential hypertension: Blood pressure is low.  Hold antihypertensives until pressure stabilizes.  #4 acute kidney  injury: BUN/creatinine appears to be stable but elevated.  Continue IV fluids creatinine improving  #5 systolic dysfunction CHF: Gentle hydration because of systolic dysfunction. EF of 35%  #6 hyperkalemia: Patient to be monitored.  -lokelma today  #7 COPD: Stable no exacerbation  #8 hypothyroidism   DVT prophylaxis: Lovenox Code Status: DNR Family Communication: Daughter in the room Son Mehki Klumpp on the phone Disposition Plan: home with hospice Consults called: Oncology Admission status: Inpatient    TOTAL TIME TAKING CARE OF THIS PATIENT: 35 minutes.  >50% time spent on counselling and coordination of care  Note: This dictation was prepared with Dragon dictation along with smaller phrase technology. Any transcriptional errors that result from this process are unintentional.  Fritzi Mandes M.D    [facility]   CC: Primary care physician; [facility] HealthPatient ID: Donald Gibbs, male   DOB: [birth year], 85 y.o.   MRN: [id number]

## 2019-10-31 NOTE — Progress Notes (Addendum)
[facility] Liaison note:  New referral for [facility] hospice services at home received from [place].  Patient information sent to referral. Hospice eligibility confirmed.  Writer me in the room with patient and his daughter Butch Penny to initiate education regarding hospice services, philosophy and team approach to care with understanding voiced. No DME needs at this time.  Writer to speak with patient's son Jeanell Sparrow when he arrives today.  Per hospital care team plan is for discharge home tomorrow. HOME ADDRESS IS: [address] [place], [place] [address]  Thank you for the opportunity to be involved in the care of this patient and his family. Hospice contact number left with Butch Penny. Flo Shanks BSN, RN, [facility] Liaison [facility]

## 2019-10-31 NOTE — Consult Note (Signed)
Referring Physician:  No referring provider defined for this encounter.  Primary Physician:  [facility]  Chief Complaint:  Altered mental status, brain imaging concerning for lesions  History of Present Illness: Donald Gibbs is a 85 y.o. male who presents to the ED with altered mental status and concern for sepsis.  As part of his AMS evaluation, a head CT was completed which shows a 1.8 cm right frontal lobe hyperdense mass suspicious for metastatic disease, potentially hemorrhagic metastatic focus.  Also 1.6 cm right cerebellar mass negative for midline shift.  Extensive hypodensity within the bilateral white matter much of which is suspected to be secondary to chronic small vessel ischemic change.  MRI of the brain showed 2 cm right cerebellar metastasis.  Also 1.7 cm right posterior parietal metastasis one-point centimeter right frontoparietal junction, all of these with petechial blood products and surrounding vasogenic edema   On exam, pt is alert and awake but disoriented to place and time. He appears mildly agitated but displays no focal neurologic deficit.   He is a poor historian and most of history is obtained through chart review.   Review of Systems:  A 10 point review of systems is negative, except for the pertinent positives and negatives detailed in the HPI.  Past Medical History: Past Medical History:  Diagnosis Date  . Cardiomyopathy    secondary. echo-EF 25-35%. Adensoine myoview- EF 31% w/a moderately dilated ventricle. there was thinning of the inferior wall, tissue attenuation v infarct, no ischemia.   Marland Kitchen COPD (chronic obstructive pulmonary disease) ([place])   . Decreased left ventricular function   . HTN (hypertension)     Past Surgical History: History reviewed. No pertinent surgical history.  Allergies: Allergies as of 10/30/2019  . (No Known Allergies)    Medications:  Current Facility-Administered Medications:  .  0.9 %  sodium  chloride infusion, , Intravenous, Continuous, Garba, Mohammad L, MD, Last Rate: 50 mL/hr at 10/31/19 0216, New Bag at 10/31/19 0216 .  acetaminophen (TYLENOL) tablet 650 mg, 650 mg, Oral, Q6H PRN **OR** acetaminophen (TYLENOL) suppository 650 mg, 650 mg, Rectal, Q6H PRN, Garba, Mohammad L, MD .  ceFEPIme (MAXIPIME) 2 g in sodium chloride 0.9 % 100 mL IVPB, 2 g, Intravenous, Q24H, Garba, Mohammad L, MD .  dexamethasone (DECADRON) injection 4 mg, 4 mg, Intravenous, Q8H, Brahmanday, Govinda R, MD .  heparin injection 5,000 Units, 5,000 Units, Subcutaneous, Q8H, Garba, Mohammad L, MD .  metroNIDAZOLE (FLAGYL) IVPB 500 mg, 500 mg, Intravenous, Q8H, Garba, Mohammad L, MD, Last Rate: 100 mL/hr at 10/31/19 0216, 500 mg at 10/31/19 0216 .  ondansetron (ZOFRAN) tablet 4 mg, 4 mg, Oral, Q6H PRN **OR** ondansetron (ZOFRAN) injection 4 mg, 4 mg, Intravenous, Q6H PRN, [name], MD .  Derrill Memo ON 11/01/2019] vancomycin (VANCOCIN) IVPB 1000 mg/200 mL premix, 1,000 mg, Intravenous, Q48H, Garba, Henderson Newcomer, MD   Social History: Social History   Tobacco Use  . Smoking status: Former Research scientist (life sciences)  . Smokeless tobacco: Never Used  Substance Use Topics  . Alcohol use: Not Currently  . Drug use: Not Currently    Family Medical History: Family History  Problem Relation Age of Onset  . Heart attack Brother     Physical Examination: Vitals:   10/31/19 0427 10/31/19 0822  BP: 107/87 (!) 115/52  Pulse: 84 81  Resp: 20 16  Temp: 98.3 F (36.8 C) 97.9 F (36.6 C)  SpO2: 91% 95%     General: Patient is cachectic  Psychiatric: Patient is mildly agitated  Head:  Pupils equal, round, and reactive to light.  Neck:   Supple.  Full range of motion, no TTP over cervical spine.    NEUROLOGICAL:  General: In no acute distress.   Awake, alert, disoriented to place, time. Performs simple commands. Able to count from 1-10 but not 10-1.  Speech is fluent. Names 2/2 objects correctly. Repeats simple phrases  appropriately.   EOMI, no nystagmus noted. PERRLA. Facial sensation equal. Face symmetric, edentulous.  Tongue midline Hearing grossly intact.  Trapezius full.  SILT to all extremities Normal tone to all extremities, decreased bulk throughout, symmetric.   Able to move all extremities equally bilaterally, full strength to all extremities, equal bilaterally. No drift noted, although unable to complete formal pronator drift testing due to patient not cooperating with exam as he reports that he is cold.   ROM of spine: deferred due to patient not cooperating with exam. No TTP over cervical spine.  Imaging: MRI brain 10/30/2019:  IMPRESSION: 2 cm right cerebellar metastasis. 1.7 cm right posterior parietal metastasis. 1.8 cm right frontoparietal junction metastasis. All 3 of these are associated with petechial blood products and surrounding vasogenic edema.  Old inferior cerebellar infarctions more extensive on the left than the right. Old brainstem infarctions and thalamic infarctions. Chronic small-vessel ischemic changes throughout the white matter.   CT head 10/30/2019: IMPRESSION: 1. 1.8 cm right frontal lobe hyperdense mass, suspicious for metastatic disease, potentially hemorrhagic metastatic focus. Additional suspected 1.6 cm right cerebellar mass. Negative for midline shift. 2. Extensive hypodensity within the bilateral white matter, much of which is suspected to be secondary to chronic small vessel ischemic change. More focal hypodensity within the inferior right frontal lobe white matter could reflect edema related to metastatic disease versus small chronic infarct. There is atrophy. 3. Chronic left cerebellar infarct  CT Cspine 10/30/2019: IMPRESSION: 1. No acute fracture identified. 2 mm anterolisthesis C6 on C7 likely degenerative 2. Incompletely visualized right upper chest wall mass with bony destruction of second and third ribs.  Assessment and Plan: [name] is a pleasant 85 y.o. male with brain lesions concerning for metastatic disease.  - sepsis tx per medical team - AED such as keppra 500 BID - ok to hold dexamethasone unless needed for other treatment - MRI brain with and without contrast reviewed, multiple likely metastatic lesions identified.   Recommend radiation oncology treatment and follow up.  - Agree with admission to medicine service vs ICU - No follow up with neurosurgery indicated at this time  Thank you for this consult.  Lonell Face, NP  Dept. of Neurosurgery

## 2019-10-31 NOTE — TOC Initial Note (Signed)
Transition of Care [facility]) - Initial/Assessment Note    Patient Details  Name: Donald Gibbs MRN: [id number] Date of Birth: March 23, 1935  [facility]) CM/SW Contact:    Elease Hashimoto, LCSW Phone Number: 10/31/2019, 2:17 PM  Clinical Narrative:    Spoke with son-Ray via telephone to discuss hospice services. He reports the family his Mom and siblings-6 children want this. They have chosen [facility] and karen to meet with them this afternoon and work on discharge home tomorrow with hospice. Pt has been made DNR and does not need any other pieces of equipment. Work toward Quarry manager.               Expected Discharge Plan: Home w Hospice Care Barriers to Discharge: Barriers Resolved   Patient Goals and CMS Choice   CMS Medicare.gov Compare Post Acute Care list provided to:: Patient Represenative (must comment) (Ray-son-POA) Choice offered to / list presented to : [place] / Guardian  Expected Discharge Plan and Services Expected Discharge Plan: Home w Hospice Care In-house Referral: Clinical Social Work     Living arrangements for the past 2 months: Single Family Home                                      Prior Living Arrangements/Services Living arrangements for the past 2 months: Single Family Home Lives with:: Spouse Patient language and need for interpreter reviewed:: No Do you feel safe going back to the place where you live?: Yes      Need for Family Participation in Patient Care: Yes (Comment) Care giver support system in place?: Yes (comment) Current home services: DME (has rw and canes along with [facility]) Criminal Activity/Legal Involvement Pertinent to Current Situation/Hospitalization: No - Comment as needed  Activities of Daily Living Home Assistive Devices/Equipment: None ADL Screening (condition at time of admission) Patient's cognitive ability adequate to safely complete daily activities?: No Is the patient deaf or have difficulty  hearing?: No Does the patient have difficulty seeing, even when wearing glasses/contacts?: Yes Does the patient have difficulty concentrating, remembering, or making decisions?: Yes Patient able to express need for assistance with ADLs?: Yes Does the patient have difficulty dressing or bathing?: Yes Independently performs ADLs?: No Does the patient have difficulty walking or climbing stairs?: Yes Weakness of Legs: Both Weakness of Arms/Hands: Both  Permission Sought/Granted Permission sought to share information with : Family Supports, Chartered certified accountant granted to share information with : Yes, Verbal Permission Granted  Share Information with NAME: Ray  Permission granted to share info w AGENCY: [facility]  Permission granted to share info w Relationship: son  Permission granted to share info w Contact Information: Santiago Glad  Emotional Assessment Appearance:: Appears stated age Attitude/Demeanor/Rapport: Gracious Affect (typically observed): Accepting Orientation: : Oriented to Self   Psych Involvement: No (comment)  Admission diagnosis:  Weakness [R53.1] Brain metastases (HCC) [C79.31] Hypotension [I95.9] Malignant neoplasm of lung, unspecified laterality, unspecified part of lung (HCC) [C34.90] Sepsis, due to unspecified organism, unspecified whether acute organ dysfunction present [facility]) [A41.9] Patient Active Problem List   Diagnosis Date Noted  . Sepsis ([place])   . Hypotension 10/30/2019  . Brain metastases ([place]) 10/30/2019  . ARF (acute renal failure) ([place]) 10/30/2019  . Hyperkalemia 10/30/2019  . 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 Chronic systolic CHF (congestive heart failure) ([place]) 10/04/2019  . HLD (hyperlipidemia) 10/04/2019  . Hypothyroidism 10/04/2019  .  Protein-calorie malnutrition, severe ([place]) 10/04/2019  . Malignant neoplasm of upper lobe of right lung ([place])   . Mass of pancreas   . Essential hypertension 11/05/2008  . Secondary cardiomyopathy  ([place]) 11/05/2008  . LEFT VENTRICULAR FUNCTION, DECREASED 11/05/2008  . COPD with chronic bronchitis ([place]) 11/05/2008   PCP:  [facility], [place]:   [id number] - [place], [place] - [address] [place] [address] Phone: [phone] Fax: [phone]     Social Determinants of Health (SDOH) Interventions    Readmission Risk Interventions No flowsheet data found.

## 2019-11-01 DIAGNOSIS — C3490 Malignant neoplasm of unspecified part of unspecified bronchus or lung: Secondary | ICD-10-CM

## 2019-11-01 DIAGNOSIS — N179 Acute kidney failure, unspecified: Secondary | ICD-10-CM

## 2019-11-01 DIAGNOSIS — R6520 Severe sepsis without septic shock: Secondary | ICD-10-CM

## 2019-11-01 LAB — BASIC METABOLIC PANEL
Anion gap: 8 (ref 5–15)
BUN: 55 mg/dL — ABNORMAL HIGH (ref 8–23)
CO2: 23 mmol/L (ref 22–32)
Calcium: 7.4 mg/dL — ABNORMAL LOW (ref 8.9–10.3)
Chloride: 101 mmol/L (ref 98–111)
Creatinine, Ser: 1.06 mg/dL (ref 0.61–1.24)
GFR calc Af Amer: >60 mL/min (ref >60)
GFR calc non Af Amer: >60 mL/min (ref >60)
Glucose, Bld: 96 mg/dL (ref 70–99)
Potassium: 5.3 mmol/L — ABNORMAL HIGH (ref 3.5–5.1)
Sodium: 132 mmol/L — ABNORMAL LOW (ref 135–145)

## 2019-11-01 LAB — GLUCOSE, CAPILLARY: Glucose-Capillary: 90 mg/dL (ref 70–99)

## 2019-11-01 LAB — PROCALCITONIN: Procalcitonin: 0.42 ng/mL

## 2019-11-01 MED ORDER — MORPHINE SULFATE ER 15 MG PO TBCR: 15.0000 mg | EXTENDED_RELEASE_TABLET | Freq: Two times a day (BID) | ORAL | 0 refills | Status: AC

## 2019-11-01 MED ORDER — CEFDINIR 300 MG PO CAPS
300.0000 mg | ORAL_CAPSULE | Freq: Two times a day (BID) | ORAL | Status: DC
  Filled 2019-11-01 (×2): qty 1

## 2019-11-01 MED ORDER — LEVETIRACETAM ER 500 MG PO TB24: 500.0000 mg | ORAL_TABLET | Freq: Two times a day (BID) | ORAL | 1 refills | Status: AC

## 2019-11-01 MED ORDER — CEFDINIR 300 MG PO CAPS: 300.0000 mg | ORAL_CAPSULE | Freq: Two times a day (BID) | ORAL | 0 refills | Status: AC

## 2019-11-01 NOTE — Discharge Summary (Signed)
[place] [facility] [place] NAME: Donald Gibbs    MR#:  [id number]  DATE OF BIRTH:  1934/06/06  DATE OF ADMISSION:  10/30/2019 ADMITTING PHYSICIAN: [name], MD  DATE OF DISCHARGE: 11/01/2019  PRIMARY CARE PHYSICIAN: [facility]    ADMISSION DIAGNOSIS:  Weakness [R53.1] Brain metastases (HCC) [C79.31] Hypotension [I95.9] Malignant neoplasm of lung, unspecified laterality, unspecified part of lung (HCC) [C34.90] Sepsis, due to unspecified organism, unspecified whether acute organ dysfunction present ([place]) [A41.9]  DISCHARGE DIAGNOSIS:  Sepsis with AKI POA--improved Suspected Post obstructive pneumonia 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 Cardiomyopathy    secondary. echo-EF 25-35%. Adensoine myoview- EF 31% w/a moderately dilated ventricle. there was thinning of the inferior wall, tissue attenuation v infarct, no ischemia.   Marland Kitchen COPD (chronic obstructive pulmonary disease) ([place])   . Decreased left ventricular function   . HTN (hypertension)     HOSPITAL COURSE:   Donald Gibbs a 85 y.o.malewith medical history significant ofsystolic dysfunction CHF with EF of 25 to 35%, stage IV lung cancer, right upper lung mass, pancreatic mass, history of radiation therapy, COPD, hypertension, who was brought in with significant hypotension weakness. Patient is weak and debilitated. Temperature 94.1, blood pressure 70/40, pulse 94 respirate 22 on oxygen sats 92% room air. White count 34.1  Sepsis with Acute renal failure POA -came in with hypothermia, hypotension, tachycardia, elevated lactate, elevated Pro calcitonin and chest x-Donald Gibbs with right large upper lobe lung mass with possible post-obstructive pneumonia.-Received IV fluid -blood pressure much improved -continue IV  cefepime--change to po ceftin -M RSA PCR negative -blood cultures negative -UA and urine culture pending--sample not obtained -lactic acid 2.0-- 1.8 -Pro calcitonin 2.6-- .99 -remains hemodynamically stbale   #2 right lung mass with metal stasis to brain and bones  - IV dexamethasone--change to po -PO Keppra for antiseizure prophylaxis  -seen by neurosurgery  -patient follows with Dr. Rogue Bussing. Spoke with son. Overall poor prognosis. Dr. B discussed with family and they are in agreement with home hospice. -Code status discussed with son Donald Gibbs-- DNR   #3 essential hypertension:Blood pressure is low. Hold antihypertensives until pressure stabilizes.  #4 acute kidney injury:BUN/creatinine appears to be stable but elevated. received IV fluids creatinine improved  #5 systolic dysfunction ASN:KNLZJQ hydration because of systolic dysfunction. EF of 35%  #6 hyperkalemia:due to diet. Pt at home mainly eats banana, grapes and peaches(high in K) -lokelma yday -Son is aware about k 5.3.  #7 COPD:Stable no exacerbation  #8 hypothyroidism  Prognosis poor Pt will discharge to home with Hospice  DVT prophylaxis:Lovenox Code Status:DNR Family Communication:Son Donald Gibbs in the room Disposition Plan: home with hospice today Consults called:Oncology Admission status:Inpatient    CONSULTS OBTAINED:    DRUG ALLERGIES:  No Known Allergies  DISCHARGE MEDICATIONS:   Allergies as of 11/01/2019   No Known Allergies     Medication List    TAKE these medications   cefdinir 300 MG capsule Commonly known as: OMNICEF Take 1 capsule (300 mg total) by mouth every 12 (twelve) hours.   dexamethasone 4 MG tablet Commonly known as: DECADRON Take 1 tablet (4 mg total) by mouth 2 (two) times daily.   feeding supplement (ENSURE ENLIVE) Liqd Take 237 mLs by mouth 2 (two) times daily between meals. What changed: additional instructions   HYDROcodone-acetaminophen  10-325 MG  tablet Commonly known as: Norco Take 1 tablet by mouth every 6 (six) hours as needed.   levETIRAcetam 500 MG 24 hr tablet Commonly known as: KEPPRA XR Take 1 tablet (500 mg total) by mouth 2 (two) times daily.   morphine 15 MG 12 hr tablet Commonly known as: MS CONTIN Take 1 tablet (15 mg total) by mouth every 12 (twelve) hours.   ondansetron 4 MG tablet Commonly known as: Zofran Take 1 tablet (4 mg total) by mouth every 8 (eight) hours as needed for nausea or vomiting.       If you experience worsening of your admission symptoms, develop shortness of breath, life threatening emergency, suicidal or homicidal thoughts you must seek medical attention immediately by calling 911 or calling your MD immediately  if symptoms less severe.  You Must read complete instructions/literature along with all the possible adverse reactions/side effects for all the Medicines you take and that have been prescribed to you. Take any new Medicines after you have completely understood and accept all the possible adverse reactions/side effects.   Please note  You were cared for by a hospitalist during your hospital stay. If you have any questions about your discharge medications or the care you received while you were in the hospital after you are discharged, you can call the unit and asked to speak with the hospitalist on call if the hospitalist that took care of you is not available. Once you are discharged, your primary care physician will handle any further medical issues. Please note that NO REFILLS for any discharge medications will be authorized once you are discharged, as it is imperative that you return to your primary care physician (or establish a relationship with a primary care physician if you do not have one) for your aftercare needs so that they can reassess your need for medications and monitor your lab values. Today   SUBJECTIVE   I feel good. I want to go home Son in the  room  VITAL SIGNS:  Blood pressure (!) 159/72, pulse 87, temperature 98.2 F (36.8 C), temperature source Oral, resp. rate 16, height 5\' 9"  (1.753 m), weight 51.3 kg, SpO2 96 %.  I/O:    Intake/Output Summary (Last 24 hours) at 11/01/2019 1016 Last data filed at 11/01/2019 0920 Gross per 24 hour  Intake 936.78 ml  Output 200 ml  Net 736.78 ml    PHYSICAL EXAMINATION:  GENERAL:  85 y.o.-year-old patient lying in the bed with no acute distress. Thin, fraile LUNGS: coarse breath sounds bilaterally, no wheezing, rales,rhonchi or crepitation. No use of accessory muscles of respiration.  CARDIOVASCULAR: S1, S2 normal. No murmurs, rubs, or gallops.  ABDOMEN: Soft, non-tender, non-distended. Bowel sounds present. No organomegaly or mass.  EXTREMITIES: No pedal edema, cyanosis, or clubbing.  NEUROLOGIC: . Non focal grossly PSYCHIATRIC: The patient is alert and oriented x 1 SKIN: No obvious rash, lesion, or ulcer.   DATA REVIEW:   CBC  Recent Labs  Lab 10/31/19 0936  WBC 33.9*  HGB 10.8*  HCT 31.8*  PLT 278    Chemistries  Recent Labs  Lab 10/31/19 0936 10/31/19 0936 11/01/19 0644  NA 134*   < > 132*  K 5.3*   < > 5.3*  CL 102   < > 101  CO2 21*   < > 23  GLUCOSE 113*   < > 96  BUN 61*   < > 55*  CREATININE 1.43*   < > 1.06  CALCIUM 7.5*   < >  7.4*  AST 32  --   --   ALT 15  --   --   ALKPHOS 78  --   --   BILITOT 0.8  --   --    < > = values in this interval not displayed.    Microbiology Results   Recent Results (from the past 240 hour(s))  Blood culture (routine x 2)     Status: None (Preliminary result)   Collection Time: 10/30/19  5:14 PM   Specimen: BLOOD  Result Value Ref Range Status   Specimen Description BLOOD RIGHT ANTECUBITAL  Final   Special Requests   Final    BOTTLES DRAWN AEROBIC ONLY Blood Culture adequate volume   Culture   Final    NO GROWTH 2 DAYS Performed at [facility], [address], [place], [place] [address]    Report  Status PENDING  Incomplete  Blood culture (routine x 2)     Status: None (Preliminary result)   Collection Time: 10/30/19  5:14 PM   Specimen: BLOOD  Result Value Ref Range Status   Specimen Description BLOOD LEFT ANTECUBITAL  Final   Special Requests   Final    BOTTLES DRAWN AEROBIC AND ANAEROBIC Blood Culture adequate volume   Culture   Final    NO GROWTH 2 DAYS Performed at [facility], [address], [place], [place] [address]    Report Status PENDING  Incomplete  SARS Coronavirus 2 by RT PCR (hospital order, performed in [facility] hospital lab) Nasopharyngeal Nasopharyngeal Swab     Status: None   Collection Time: 10/30/19  5:38 PM   Specimen: Nasopharyngeal Swab  Result Value Ref Range Status   SARS Coronavirus 2 NEGATIVE NEGATIVE Final    Comment: (NOTE) SARS-CoV-2 target nucleic acids are NOT DETECTED.  The SARS-CoV-2 RNA is generally detectable in upper and lower respiratory specimens during the acute phase of infection. The lowest concentration of SARS-CoV-2 viral copies this assay can detect is 250 copies / mL. A negative result does not preclude SARS-CoV-2 infection and should not be used as the sole basis for treatment or other patient management decisions.  A negative result may occur with improper specimen collection / handling, submission of specimen other than nasopharyngeal swab, presence of viral mutation(s) within the areas targeted by this assay, and inadequate number of viral copies (<250 copies / mL). A negative result must be combined with clinical observations, patient history, and epidemiological information.  Fact Sheet for Patients:   StrictlyIdeas.no  Fact Sheet for Healthcare Providers: BankingDealers.co.za  This test is not yet approved or  cleared by the [place] FDA and has been authorized for detection and/or diagnosis of SARS-CoV-2 by FDA under an Emergency Use Authorization  (EUA).  This EUA will remain in effect (meaning this test can be used) for the duration of the COVID-19 declaration under Section 564(b)(1) of the Act, 21 U.S.C. section 360bbb-3(b)(1), unless the authorization is terminated or revoked sooner.  Performed at [facility], [address] [place], [place] [address]   MRSA PCR Screening     Status: None   Collection Time: 10/31/19  4:00 PM   Specimen: Nasal Mucosa; Nasopharyngeal  Result Value Ref Range Status   MRSA by PCR NEGATIVE NEGATIVE Final    Comment:        The GeneXpert MRSA Assay (FDA approved for NASAL specimens only), is one component of a comprehensive MRSA colonization surveillance program. It is not intended to diagnose  MRSA infection nor to guide or monitor treatment for MRSA infections. Performed at [facility], [address] [place], [place] [address]     RADIOLOGY:  CT ABDOMEN PELVIS WO CONTRAST  Result Date: 10/30/2019 CLINICAL DATA:  Non-small cell right lung cancer, hypotension, abdominal pain, fever, multiple falls EXAM: CT CHEST, ABDOMEN AND PELVIS WITHOUT CONTRAST TECHNIQUE: Multidetector CT imaging of the chest, abdomen and pelvis was performed following the standard protocol without IV contrast. COMPARISON:  10/22/2019 FINDINGS: CT CHEST FINDINGS Cardiovascular: Heart is stable without pericardial effusion. Extensive atherosclerosis of the aorta and coronary vessels. Mediastinum/Nodes: No enlarged mediastinal, hilar, or axillary lymph nodes. Thyroid gland, trachea, and esophagus demonstrate no significant findings. Lungs/Pleura: The large infiltrative mass centered in the right upper lobe is again identified, with extension through the right chest wall and destruction of the right right second through fifth ribs. No significant change since prior study. Stable small right pleural effusion. Diffuse background emphysema. No pneumothorax. Musculoskeletal: There is bony destruction of the  right second through fifth ribs related to the invasive right upper lobe mass. There are no acute displaced fractures. Reconstructed images demonstrate no additional findings. CT ABDOMEN PELVIS FINDINGS Hepatobiliary: Marked distension of the gallbladder without cholelithiasis or cholecystitis. No focal liver abnormalities on this unenhanced exam. Pancreas: Marked pancreatic atrophy with no focal parenchymal abnormalities. Spleen: Normal in size without focal abnormality. Adrenals/Urinary Tract: Stable left renal cyst. No urinary tract calculi or obstructive uropathy within either kidney. The bladder is unremarkable. Bilateral adrenal masses are compatible with known metastatic disease, stable. Stomach/Bowel: No bowel obstruction or ileus. Diffuse diverticulosis of the distal colon without diverticulitis. There is marked fecal retention. No bowel wall thickening or inflammatory change. Vascular/Lymphatic: Large lymph node mass at the porta hepatis measures 7.1 x 5.5 cm, unchanged since recent PET scan. Metastatic deposit in the retroperitoneum lateral to the left kidney unchanged since recent PET scan, measuring up to 2.7 cm. Diffuse atherosclerosis unchanged. Reproductive: Prostate is unremarkable. Other: No free fluid or free gas. No abdominal wall hernia. Musculoskeletal: No acute or destructive bony lesions. IMPRESSION: 1. No significant interval change in the large infiltrative mass centered in the right upper lobe, with extension through the right chest wall and destruction of the right second through fifth ribs. 2. Stable small right pleural effusion. 3. Stable metastatic disease involving porta hepatis adenopathy, bilateral adrenal glands, and retroperitoneum lateral to the left kidney. 4. Marked fecal retention. No obstruction or ileus. 5. Aortic Atherosclerosis (ICD10-I70.0) and Emphysema (ICD10-J43.9). Electronically Signed   By: Randa Ngo M.D.   On: 10/30/2019 18:52   DG Wrist Complete  Left  Result Date: 10/30/2019 CLINICAL DATA:  Several falls with wrist pain EXAM: LEFT WRIST - COMPLETE 3+ VIEW COMPARISON:  PET CT 10/22/2019 FINDINGS: No acute displaced fracture is visualized. Large 6 cm lytic lesion within the distal ulna with associated soft tissue mass. IMPRESSION: No acute displaced fracture. Large lytic/bony destructive lesion involving the distal ulna with associated soft tissue mass either representing metastatic disease or primary bone tumor. Electronically Signed   By: Donavan Foil M.D.   On: 10/30/2019 18:15   CT Head Wo Contrast  Result Date: 10/30/2019 CLINICAL DATA:  Multiple falls history of lung cancer EXAM: CT HEAD WITHOUT CONTRAST TECHNIQUE: Contiguous axial images were obtained from the base of the skull through the vertex without intravenous contrast. COMPARISON:  MRI 06/18/2006, CT brain 06/16/2006, PET CT 10/22/2019 FINDINGS: Brain: No acute territorial infarction is visualized. Chronic left cerebellar infarct. Suspected  hypodense mass measuring 1.6 cm in the right cerebellum, series 2 image number 7. Hyperdense right frontal lobe lesion measuring 1.8 cm, series 2, image number 18. No midline shift. Extensive white matter hypodensity. Moderate atrophy. Prominent ventricles likely due to atrophy. Hypodensity within the inferior right frontal lobe white matter. Probable chronic lacunar infarcts within the bilateral basal ganglia. Vascular: No hyperdense vessels. Vertebral and carotid vascular calcification. Skull: Normal. Negative for fracture or focal lesion. Sinuses/Orbits: No acute finding. Other: None IMPRESSION: 1. 1.8 cm right frontal lobe hyperdense mass, suspicious for metastatic disease, potentially hemorrhagic metastatic focus. Additional suspected 1.6 cm right cerebellar mass. Negative for midline shift. 2. Extensive hypodensity within the bilateral white matter, much of which is suspected to be secondary to chronic small vessel ischemic change. More focal  hypodensity within the inferior right frontal lobe white matter could reflect edema related to metastatic disease versus small chronic infarct. There is atrophy. 3. Chronic left cerebellar infarct Electronically Signed   By: Donavan Foil M.D.   On: 10/30/2019 18:51   CT Chest Wo Contrast  Result Date: 10/30/2019 CLINICAL DATA:  Non-small cell right lung cancer, hypotension, abdominal pain, fever, multiple falls EXAM: CT CHEST, ABDOMEN AND PELVIS WITHOUT CONTRAST TECHNIQUE: Multidetector CT imaging of the chest, abdomen and pelvis was performed following the standard protocol without IV contrast. COMPARISON:  10/22/2019 FINDINGS: CT CHEST FINDINGS Cardiovascular: Heart is stable without pericardial effusion. Extensive atherosclerosis of the aorta and coronary vessels. Mediastinum/Nodes: No enlarged mediastinal, hilar, or axillary lymph nodes. Thyroid gland, trachea, and esophagus demonstrate no significant findings. Lungs/Pleura: The large infiltrative mass centered in the right upper lobe is again identified, with extension through the right chest wall and destruction of the right right second through fifth ribs. No significant change since prior study. Stable small right pleural effusion. Diffuse background emphysema. No pneumothorax. Musculoskeletal: There is bony destruction of the right second through fifth ribs related to the invasive right upper lobe mass. There are no acute displaced fractures. Reconstructed images demonstrate no additional findings. CT ABDOMEN PELVIS FINDINGS Hepatobiliary: Marked distension of the gallbladder without cholelithiasis or cholecystitis. No focal liver abnormalities on this unenhanced exam. Pancreas: Marked pancreatic atrophy with no focal parenchymal abnormalities. Spleen: Normal in size without focal abnormality. Adrenals/Urinary Tract: Stable left renal cyst. No urinary tract calculi or obstructive uropathy within either kidney. The bladder is unremarkable. Bilateral  adrenal masses are compatible with known metastatic disease, stable. Stomach/Bowel: No bowel obstruction or ileus. Diffuse diverticulosis of the distal colon without diverticulitis. There is marked fecal retention. No bowel wall thickening or inflammatory change. Vascular/Lymphatic: Large lymph node mass at the porta hepatis measures 7.1 x 5.5 cm, unchanged since recent PET scan. Metastatic deposit in the retroperitoneum lateral to the left kidney unchanged since recent PET scan, measuring up to 2.7 cm. Diffuse atherosclerosis unchanged. Reproductive: Prostate is unremarkable. Other: No free fluid or free gas. No abdominal wall hernia. Musculoskeletal: No acute or destructive bony lesions. IMPRESSION: 1. No significant interval change in the large infiltrative mass centered in the right upper lobe, with extension through the right chest wall and destruction of the right second through fifth ribs. 2. Stable small right pleural effusion. 3. Stable metastatic disease involving porta hepatis adenopathy, bilateral adrenal glands, and retroperitoneum lateral to the left kidney. 4. Marked fecal retention. No obstruction or ileus. 5. Aortic Atherosclerosis (ICD10-I70.0) and Emphysema (ICD10-J43.9). Electronically Signed   By: Randa Ngo M.D.   On: 10/30/2019 18:52   CT Cervical Spine Wo Contrast  Result Date: 10/30/2019 CLINICAL DATA:  Trauma multiple fall history of lung cancer EXAM: CT CERVICAL SPINE WITHOUT CONTRAST TECHNIQUE: Multidetector CT imaging of the cervical spine was performed without intravenous contrast. Multiplanar CT image reconstructions were also generated. COMPARISON:  10/30/2019 radiographs FINDINGS: Alignment: 2 mm anterolisthesis C6 on C7. Generalized straightening of the cervical spine. Skull base and vertebrae: No acute fracture. No primary bone lesion or focal pathologic process. Soft tissues and spinal canal: No prevertebral fluid or swelling. No visible canal hematoma. Disc levels:  Moderate diffuse degenerative changes throughout the cervical spine with multiple level disc space narrowing and osteophytes. Partial ankylosis at C2-C3, C5-C6. Bridging osteophyte C5 through C7. Mild chronic appearing superior endplate deformity at T1. Facet degenerative changes at multiple levels. Multiple level bilateral foraminal narrowing. Upper chest: Incompletely visualized right upper chest wall and lung mass with right second and third rib destruction. Other: None IMPRESSION: 1. No acute fracture identified. 2 mm anterolisthesis C6 on C7 likely degenerative 2. Incompletely visualized right upper chest wall mass with bony destruction of second and third ribs. Electronically Signed   By: Donavan Foil M.D.   On: 10/30/2019 19:00   MR Brain W and Wo Contrast  Result Date: 10/30/2019 CLINICAL DATA:  History of lung cancer.  Falling.  Abnormal head CT. EXAM: MRI HEAD WITHOUT AND WITH CONTRAST TECHNIQUE: Multiplanar, multiecho pulse sequences of the brain and surrounding structures were obtained without and with intravenous contrast. CONTRAST:  38mL GADAVIST GADOBUTROL 1 MMOL/ML IV SOLN COMPARISON:  CT earlier same day.  MRI 06/18/2006 FINDINGS: The study suffers from motion degradation. Brain: There is old infarction affecting the inferior cerebellum more extensive on the left than the right. Old small vessel infarctions are present throughout the pons. Old small vessel infarctions affect the thalami. Old small vessel ischemic change throughout the cerebral hemispheric white matter. 2 cm in diameter metastasis within the right cerebellum. Some internal hemorrhage. Surrounding vasogenic edema. 1.7 cm metastasis in the right posterior parietal lobe. Small amount of internal hemorrhage. Associated vasogenic edema. 1.8 cm in diameter metastasis at the right frontoparietal junction at the vertex. Some internal hemorrhage. Surrounding vasogenic edema. No midline shift.  No hydrocephalus.  No extra-axial collection.  Vascular: Major vessels at the base of the brain show flow. Skull and upper cervical spine: Negative Sinuses/Orbits: Clear/normal Other: None IMPRESSION: 2 cm right cerebellar metastasis. 1.7 cm right posterior parietal metastasis. 1.8 cm right frontoparietal junction metastasis. All 3 of these are associated with petechial blood products and surrounding vasogenic edema. Old inferior cerebellar infarctions more extensive on the left than the right. Old brainstem infarctions and thalamic infarctions. Chronic small-vessel ischemic changes throughout the white matter. Electronically Signed   By: Nelson Chimes M.D.   On: 10/30/2019 21:54   DG Chest Portable 1 View  Result Date: 10/30/2019 CLINICAL DATA:  Sepsis EXAM: PORTABLE CHEST 1 VIEW COMPARISON:  Radiograph 10/06/2019 FINDINGS: Redemonstration of the pleural base mass with associated destructive changes of the adjacent ribs and chest wall involvement as well as thickened reticular opacities in the right upper lung suggestive of lymphangitic spread. Suspect a small right pleural effusion much of which is likely sub pulmonic in nature. Adjacent areas of passive atelectasis are present. Underlying infection in the right upper lung or atelectatic right lung base is difficult to exclude. Left lung remains largely clear aside from some emphysematous changes. The aorta is calcified. The remaining cardiomediastinal contours are unremarkable. No other acute or suspicious osseous lesions are identified. Degenerative changes are  present in the imaged spine and shoulders. IMPRESSION: 1. Redemonstrated right pleural mass involving the right upper lobe with associated destructive changes of the adjacent ribs and chest wall involvement as well as thickened reticular opacities in the right upper lung suggestive of lymphangitic spread. Appearance is slightly progressed from comparison with increasing coalescent opacity. 2. Suspect a small right pleural effusion much of which is  likely subpulmonic in nature. 3. Presence of these findings may limit detection of underlying infection. Electronically Signed   By: Lovena Le M.D.   On: 10/30/2019 17:36     CODE STATUS:     Code Status Orders  (From admission, onward)         Start     Ordered   10/31/19 1133  Do not attempt resuscitation (DNR)  Continuous       Question Answer Comment  In the event of cardiac or respiratory ARREST Do not call a "code blue"   In the event of cardiac or respiratory ARREST Do not perform Intubation, CPR, defibrillation or ACLS   In the event of cardiac or respiratory ARREST Use medication by any route, position, wound care, and other measures to relive pain and suffering. May use oxygen, suction and manual treatment of airway obstruction as needed for comfort.   Comments spoke with son May Ozment      10/31/19 1133        Code Status History    Date Active Date Inactive Code Status Order ID Comments User Context   10/30/2019 2055 10/31/2019 1133 Full Code [id number]  [name], MD ED   10/04/2019 1800 10/07/2019 0049 Full Code [id number]  Ivor Costa, MD ED   Advance Care Planning Activity    Advance Directive Documentation     Most Recent Value  Type of Advance Directive Healthcare Power of Attorney  Pre-existing out of facility DNR order (yellow form or pink MOST form) --  "MOST" Form in Place? --       TOTAL TIME TAKING CARE OF THIS PATIENT: 40  minutes.    Fritzi Mandes M.D  [facility]    CC: Primary care physician; Center, [facility]

## 2019-11-03 ENCOUNTER — Ambulatory Visit: Payer: Medicare HMO

## 2019-11-03 ENCOUNTER — Telehealth: Payer: Self-pay | Admitting: *Deleted

## 2019-11-03 NOTE — Telephone Encounter (Signed)
I can be the hospice attending. GB

## 2019-11-03 NOTE — Telephone Encounter (Signed)
VERBAL ORDER called to [place] at [facility]

## 2019-11-03 NOTE — Telephone Encounter (Signed)
Patient was discharged home form hospital with referral to hospice. Asking if Dr B would serve as attending. Please advise

## 2019-11-04 ENCOUNTER — Ambulatory Visit: Payer: Medicare HMO

## 2019-11-04 ENCOUNTER — Encounter: Payer: Self-pay | Admitting: *Deleted

## 2019-11-04 LAB — CULTURE, BLOOD (ROUTINE X 2)
Culture: NO GROWTH
Culture: NO GROWTH
Special Requests: ADEQUATE
Special Requests: ADEQUATE

## 2019-11-04 NOTE — Progress Notes (Signed)
  Oncology Nurse Navigator Documentation  Navigator Location: [facility] (11/04/19 1400)   )Navigator Encounter Type: Appt/Treatment Plan Review (11/04/19 1400)                         Barriers/Navigation Needs: No Barriers At This Time (11/04/19 1400)   Interventions: None Required (11/04/19 1400)             Pt referred to hospice. Nothing further needed at this time.         Time Spent with Patient: 30 (11/04/19 1400)

## 2019-11-05 ENCOUNTER — Ambulatory Visit: Payer: Medicare HMO

## 2019-11-06 ENCOUNTER — Ambulatory Visit: Payer: Medicare HMO

## 2019-11-07 ENCOUNTER — Ambulatory Visit: Payer: Medicare HMO

## 2019-11-10 ENCOUNTER — Inpatient Hospital Stay: Payer: Medicare HMO | Admitting: Internal Medicine

## 2019-11-10 ENCOUNTER — Inpatient Hospital Stay: Payer: Medicare HMO

## 2019-11-10 ENCOUNTER — Inpatient Hospital Stay: Payer: Medicare HMO | Admitting: Hospice and Palliative Medicine

## 2019-11-12 NOTE — Progress Notes (Signed)
Patient under hospice care. Appointment cancelled.

## 2019-12-14 DEATH — deceased

## 2020-07-21 IMAGING — CT CT CERVICAL SPINE W/O CM
2 series · 10 of 27 positions shown, 13 images · non-contrast
Comparison: 10/30/2019 radiographs

CLINICAL DATA: Trauma multiple fall history of lung cancer

EXAM:
CT CERVICAL SPINE WITHOUT CONTRAST
TECHNIQUE: Multidetector CT imaging of the cervical spine was performed without
intravenous contrast. Multiplanar CT image reconstructions were also
generated.

[Series 3: c spine soft · axial · 0.48mm/px · z∈[+222,+332]mm · 5 of 80 slices shown, 7 images]
[im 13/80  soft-tissue]
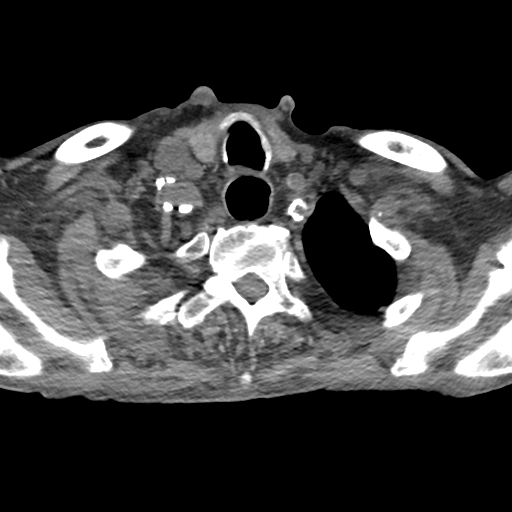
[im 13/80  bone]
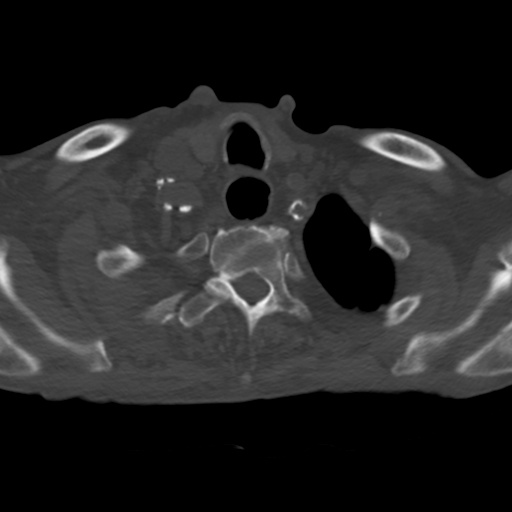
[im 25/80  bone]
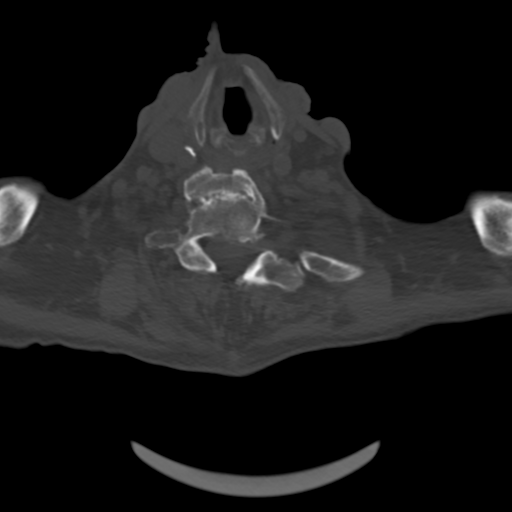
[im 43/80  bone]
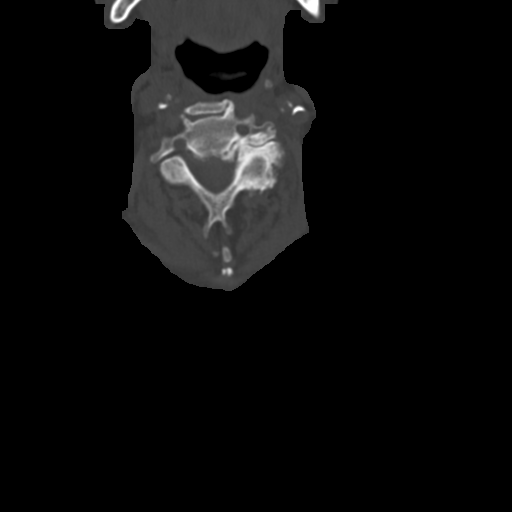
[im 55/80  bone]
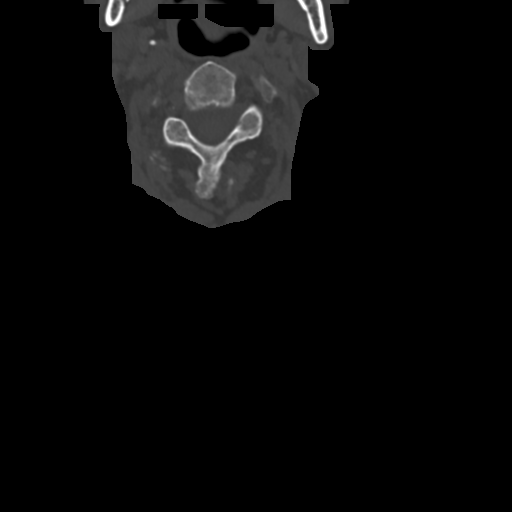
[im 67/80  soft-tissue]
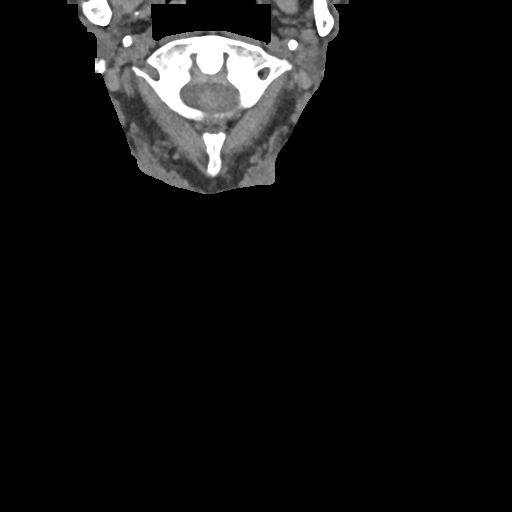
[im 67/80  bone]
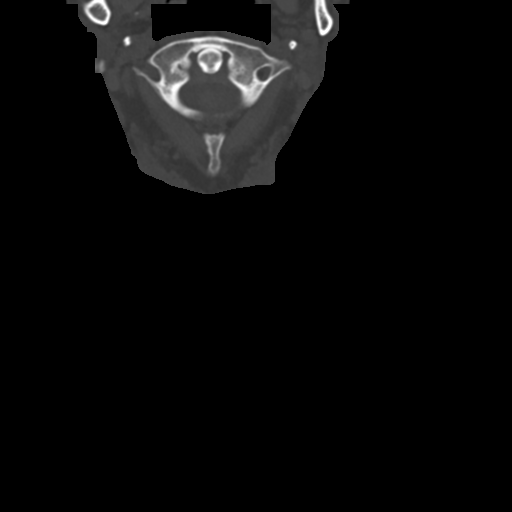

[Series 6: sagittal bone · sagittal · 0.23mm/px · 5 of 61 slices shown, 6 images]
[im 21/61  bone]
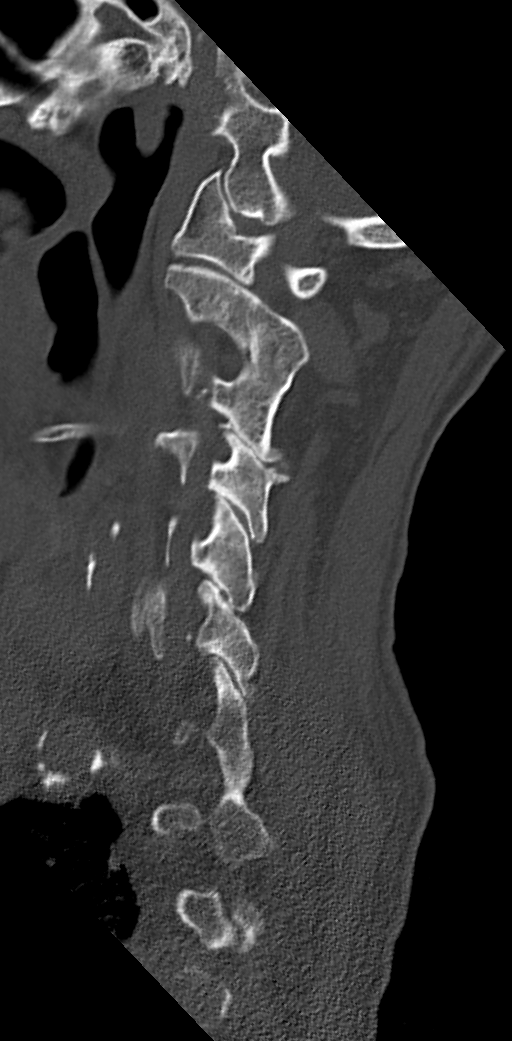
[im 26/61  bone]
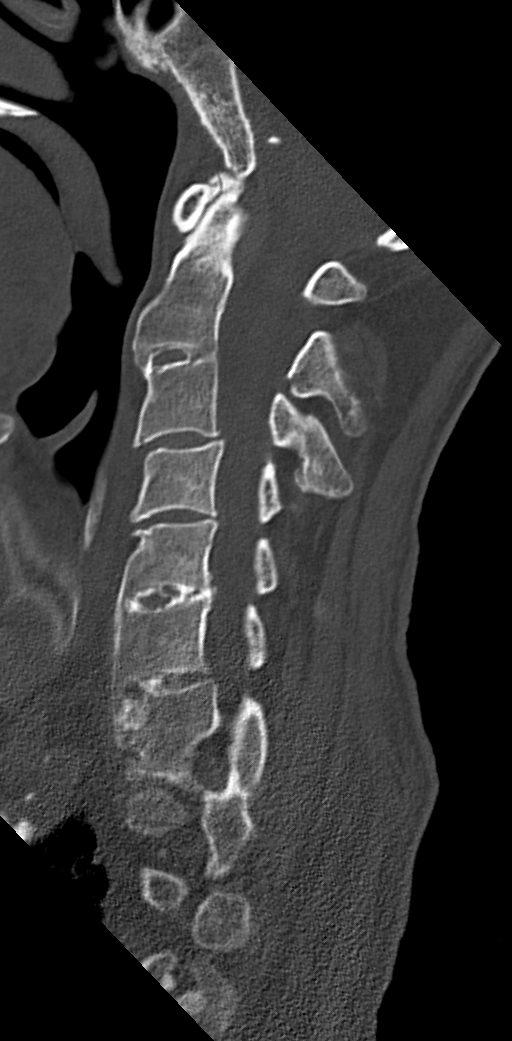
[im 31/61  soft-tissue]
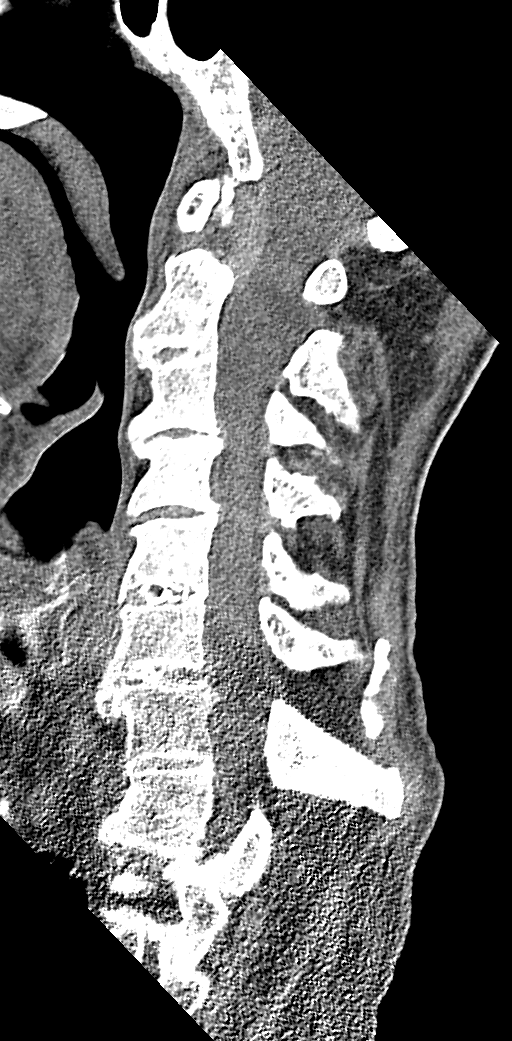
[im 31/61  bone]
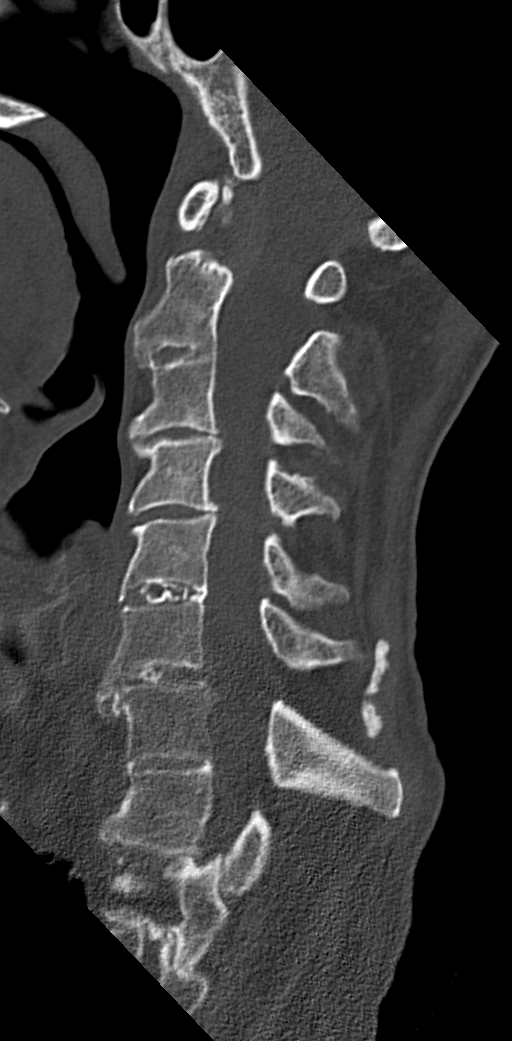
[im 36/61  bone]
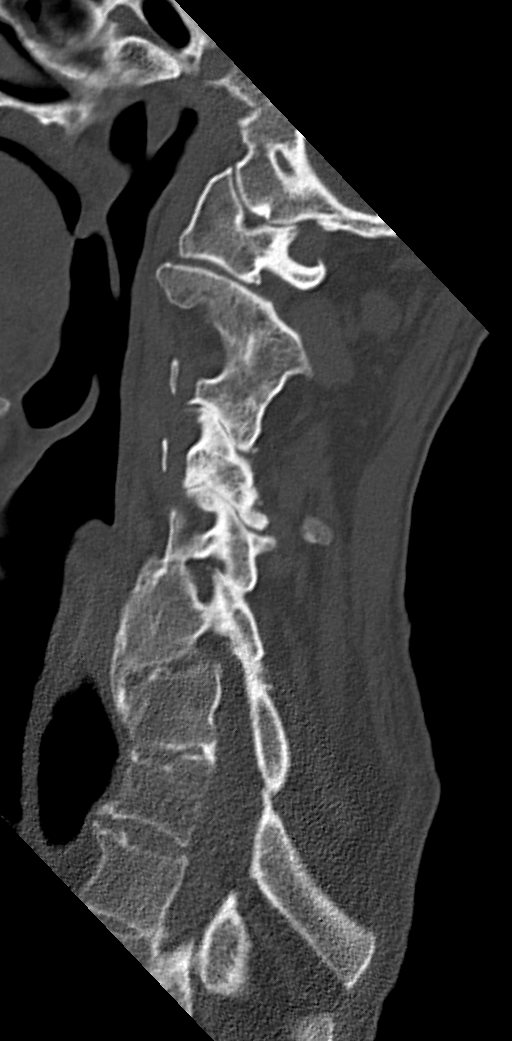
[im 41/61  bone]
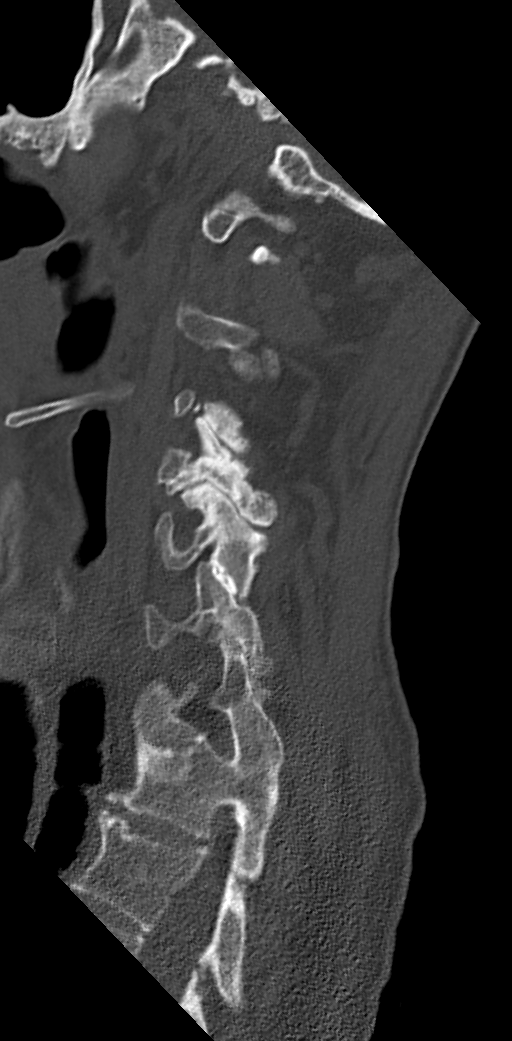

[10 of 27 positions shown; findings below may reference images not displayed]

FINDINGS: Alignment: 2 mm anterolisthesis C6 on C7. Generalized straightening
of the cervical spine.

Skull base and vertebrae: No acute fracture. No primary bone lesion
or focal pathologic process.

Soft tissues and spinal canal: No prevertebral fluid or swelling. No
visible canal hematoma.

Disc levels: Moderate diffuse degenerative changes throughout the
cervical spine with multiple level disc space narrowing and
osteophytes. Partial ankylosis at C2-C3, C5-C6. Bridging osteophyte
C5 through C7. Mild chronic appearing superior endplate deformity at
T1. Facet degenerative changes at multiple levels. Multiple level
bilateral foraminal narrowing.

Upper chest: Incompletely visualized right upper chest wall and lung
mass with right second and third rib destruction.

Other: None
IMPRESSION: 1. No acute fracture identified. 2 mm anterolisthesis C6 on C7
likely degenerative
2. Incompletely visualized right upper chest wall mass with bony
destruction of second and third ribs.
